# Patient Record
Sex: Female | Born: 1960 | Race: White | Hispanic: No | Marital: Single | Smoking: Former smoker
Health system: Southern US, Community
[De-identification: ages and names within clinical notes are randomized; demographics above are authoritative.]

## PROBLEM LIST (undated history)

## (undated) DIAGNOSIS — J449 Chronic obstructive pulmonary disease, unspecified: Secondary | ICD-10-CM

## (undated) DIAGNOSIS — K219 Gastro-esophageal reflux disease without esophagitis: Secondary | ICD-10-CM

## (undated) DIAGNOSIS — F419 Anxiety disorder, unspecified: Secondary | ICD-10-CM

## (undated) DIAGNOSIS — E119 Type 2 diabetes mellitus without complications: Secondary | ICD-10-CM

## (undated) DIAGNOSIS — G894 Chronic pain syndrome: Secondary | ICD-10-CM

## (undated) DIAGNOSIS — I10 Essential (primary) hypertension: Secondary | ICD-10-CM

## (undated) DIAGNOSIS — R519 Headache, unspecified: Secondary | ICD-10-CM

## (undated) DIAGNOSIS — G5600 Carpal tunnel syndrome, unspecified upper limb: Secondary | ICD-10-CM

## (undated) DIAGNOSIS — M1990 Unspecified osteoarthritis, unspecified site: Secondary | ICD-10-CM

## (undated) HISTORY — PX: CATARACT EXTRACTION: SUR2

## (undated) HISTORY — DX: Anxiety disorder, unspecified: F41.9

## (undated) HISTORY — PX: OTHER SURGICAL HISTORY: SHX169

## (undated) HISTORY — DX: Carpal tunnel syndrome, unspecified upper limb: G56.00

## (undated) HISTORY — PX: BACK SURGERY: SHX140

## (undated) HISTORY — DX: Chronic obstructive pulmonary disease, unspecified: J44.9

## (undated) HISTORY — PX: RIB RESECTION: SHX5077

## (undated) HISTORY — DX: Chronic pain syndrome: G89.4

---
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
## 1999-09-14 ENCOUNTER — Emergency Department (HOSPITAL_COMMUNITY): Admission: EM | Admit: 1999-09-14 | Discharge: 1999-09-14 | Payer: Self-pay | Admitting: Emergency Medicine

## 1999-09-22 ENCOUNTER — Encounter: Admission: RE | Admit: 1999-09-22 | Discharge: 1999-09-22 | Payer: Self-pay | Admitting: Hematology and Oncology

## 1999-09-22 ENCOUNTER — Encounter: Payer: Self-pay | Admitting: Hematology and Oncology

## 1999-09-22 ENCOUNTER — Ambulatory Visit (HOSPITAL_COMMUNITY): Admission: RE | Admit: 1999-09-22 | Discharge: 1999-09-22 | Payer: Self-pay | Admitting: Hematology and Oncology

## 2000-06-29 ENCOUNTER — Emergency Department (HOSPITAL_COMMUNITY): Admission: EM | Admit: 2000-06-29 | Discharge: 2000-06-29 | Payer: Self-pay | Admitting: Emergency Medicine

## 2001-02-09 ENCOUNTER — Emergency Department (HOSPITAL_COMMUNITY): Admission: EM | Admit: 2001-02-09 | Discharge: 2001-02-09 | Payer: Self-pay | Admitting: Emergency Medicine

## 2001-05-10 ENCOUNTER — Encounter: Payer: Self-pay | Admitting: Emergency Medicine

## 2001-05-10 ENCOUNTER — Emergency Department (HOSPITAL_COMMUNITY): Admission: EM | Admit: 2001-05-10 | Discharge: 2001-05-10 | Payer: Self-pay | Admitting: *Deleted

## 2001-07-31 ENCOUNTER — Encounter: Payer: Self-pay | Admitting: Emergency Medicine

## 2001-07-31 ENCOUNTER — Emergency Department (HOSPITAL_COMMUNITY): Admission: EM | Admit: 2001-07-31 | Discharge: 2001-07-31 | Payer: Self-pay | Admitting: Emergency Medicine

## 2001-08-16 ENCOUNTER — Encounter: Admission: RE | Admit: 2001-08-16 | Discharge: 2001-08-16 | Payer: Self-pay | Admitting: Internal Medicine

## 2001-08-22 ENCOUNTER — Encounter: Admission: RE | Admit: 2001-08-22 | Discharge: 2001-08-22 | Payer: Self-pay | Admitting: Internal Medicine

## 2001-09-01 ENCOUNTER — Encounter: Admission: RE | Admit: 2001-09-01 | Discharge: 2001-09-01 | Payer: Self-pay | Admitting: Internal Medicine

## 2001-09-08 ENCOUNTER — Encounter: Payer: Self-pay | Admitting: Internal Medicine

## 2001-09-08 ENCOUNTER — Ambulatory Visit (HOSPITAL_COMMUNITY): Admission: RE | Admit: 2001-09-08 | Discharge: 2001-09-08 | Payer: Self-pay | Admitting: Internal Medicine

## 2001-09-20 ENCOUNTER — Encounter: Admission: RE | Admit: 2001-09-20 | Discharge: 2001-09-20 | Payer: Self-pay | Admitting: Internal Medicine

## 2001-09-22 ENCOUNTER — Inpatient Hospital Stay (HOSPITAL_COMMUNITY): Admission: EM | Admit: 2001-09-22 | Discharge: 2001-09-25 | Payer: Self-pay | Admitting: Emergency Medicine

## 2001-09-22 ENCOUNTER — Encounter: Payer: Self-pay | Admitting: Infectious Diseases

## 2001-09-23 ENCOUNTER — Encounter: Payer: Self-pay | Admitting: Infectious Diseases

## 2001-09-29 ENCOUNTER — Encounter: Admission: RE | Admit: 2001-09-29 | Discharge: 2001-09-29 | Payer: Self-pay | Admitting: Internal Medicine

## 2001-10-06 ENCOUNTER — Encounter: Payer: Self-pay | Admitting: Infectious Diseases

## 2001-10-06 ENCOUNTER — Encounter: Admission: RE | Admit: 2001-10-06 | Discharge: 2001-10-06 | Payer: Self-pay | Admitting: Infectious Diseases

## 2001-10-09 ENCOUNTER — Encounter: Admission: RE | Admit: 2001-10-09 | Discharge: 2001-10-09 | Payer: Self-pay | Admitting: Internal Medicine

## 2001-10-20 ENCOUNTER — Encounter: Payer: Self-pay | Admitting: Infectious Diseases

## 2001-10-20 ENCOUNTER — Encounter: Admission: RE | Admit: 2001-10-20 | Discharge: 2001-10-20 | Payer: Self-pay | Admitting: Infectious Diseases

## 2001-10-27 ENCOUNTER — Encounter: Admission: RE | Admit: 2001-10-27 | Discharge: 2001-10-27 | Payer: Self-pay | Admitting: Internal Medicine

## 2001-11-29 ENCOUNTER — Encounter: Admission: RE | Admit: 2001-11-29 | Discharge: 2001-11-29 | Payer: Self-pay | Admitting: Internal Medicine

## 2001-12-19 ENCOUNTER — Encounter: Admission: RE | Admit: 2001-12-19 | Discharge: 2001-12-19 | Payer: Self-pay | Admitting: Internal Medicine

## 2002-01-23 ENCOUNTER — Encounter: Admission: RE | Admit: 2002-01-23 | Discharge: 2002-01-23 | Payer: Self-pay | Admitting: Internal Medicine

## 2002-03-30 ENCOUNTER — Encounter: Payer: Self-pay | Admitting: Orthopedic Surgery

## 2002-03-30 ENCOUNTER — Encounter (INDEPENDENT_AMBULATORY_CARE_PROVIDER_SITE_OTHER): Payer: Self-pay | Admitting: Specialist

## 2002-03-30 ENCOUNTER — Observation Stay (HOSPITAL_COMMUNITY): Admission: RE | Admit: 2002-03-30 | Discharge: 2002-03-31 | Payer: Self-pay | Admitting: Orthopedic Surgery

## 2002-04-17 ENCOUNTER — Encounter: Admission: RE | Admit: 2002-04-17 | Discharge: 2002-04-17 | Payer: Self-pay | Admitting: Internal Medicine

## 2002-05-17 ENCOUNTER — Encounter: Admission: RE | Admit: 2002-05-17 | Discharge: 2002-05-17 | Payer: Self-pay | Admitting: Internal Medicine

## 2002-07-03 ENCOUNTER — Encounter: Admission: RE | Admit: 2002-07-03 | Discharge: 2002-07-03 | Payer: Self-pay | Admitting: Internal Medicine

## 2002-07-29 ENCOUNTER — Inpatient Hospital Stay (HOSPITAL_COMMUNITY): Admission: EM | Admit: 2002-07-29 | Discharge: 2002-08-04 | Payer: Self-pay | Admitting: Psychiatry

## 2002-07-30 ENCOUNTER — Encounter: Payer: Self-pay | Admitting: Psychiatry

## 2002-08-08 ENCOUNTER — Encounter: Admission: RE | Admit: 2002-08-08 | Discharge: 2002-08-08 | Payer: Self-pay | Admitting: Internal Medicine

## 2002-08-19 ENCOUNTER — Emergency Department (HOSPITAL_COMMUNITY): Admission: EM | Admit: 2002-08-19 | Discharge: 2002-08-19 | Payer: Self-pay | Admitting: Emergency Medicine

## 2002-09-25 ENCOUNTER — Encounter: Admission: RE | Admit: 2002-09-25 | Discharge: 2002-09-25 | Payer: Self-pay | Admitting: Internal Medicine

## 2002-10-02 ENCOUNTER — Encounter: Payer: Self-pay | Admitting: Internal Medicine

## 2002-10-02 ENCOUNTER — Ambulatory Visit (HOSPITAL_COMMUNITY): Admission: RE | Admit: 2002-10-02 | Discharge: 2002-10-02 | Payer: Self-pay | Admitting: Internal Medicine

## 2002-11-02 ENCOUNTER — Encounter: Admission: RE | Admit: 2002-11-02 | Discharge: 2002-11-02 | Payer: Self-pay | Admitting: Internal Medicine

## 2002-11-05 ENCOUNTER — Encounter: Admission: RE | Admit: 2002-11-05 | Discharge: 2002-11-05 | Payer: Self-pay | Admitting: Internal Medicine

## 2002-11-23 ENCOUNTER — Encounter: Admission: RE | Admit: 2002-11-23 | Discharge: 2002-11-23 | Payer: Self-pay | Admitting: Internal Medicine

## 2003-01-28 ENCOUNTER — Encounter: Admission: RE | Admit: 2003-01-28 | Discharge: 2003-01-28 | Payer: Self-pay | Admitting: Internal Medicine

## 2003-02-28 ENCOUNTER — Encounter: Admission: RE | Admit: 2003-02-28 | Discharge: 2003-02-28 | Payer: Self-pay | Admitting: Internal Medicine

## 2003-05-30 ENCOUNTER — Encounter: Admission: RE | Admit: 2003-05-30 | Discharge: 2003-05-30 | Payer: Self-pay | Admitting: Internal Medicine

## 2003-06-24 ENCOUNTER — Ambulatory Visit (HOSPITAL_COMMUNITY): Admission: RE | Admit: 2003-06-24 | Discharge: 2003-06-24 | Payer: Self-pay | Admitting: Pain Medicine

## 2004-08-28 ENCOUNTER — Other Ambulatory Visit: Payer: Self-pay

## 2004-08-28 ENCOUNTER — Ambulatory Visit: Payer: Self-pay | Admitting: Orthopedic Surgery

## 2004-09-04 ENCOUNTER — Ambulatory Visit: Payer: Self-pay | Admitting: Orthopedic Surgery

## 2004-10-08 ENCOUNTER — Ambulatory Visit: Payer: Self-pay | Admitting: Internal Medicine

## 2009-06-26 ENCOUNTER — Emergency Department (HOSPITAL_COMMUNITY): Admission: EM | Admit: 2009-06-26 | Discharge: 2009-06-26 | Payer: Self-pay | Admitting: Emergency Medicine

## 2009-07-16 ENCOUNTER — Encounter: Admission: RE | Admit: 2009-07-16 | Discharge: 2009-07-16 | Payer: Self-pay | Admitting: Unknown Physician Specialty

## 2009-10-16 ENCOUNTER — Inpatient Hospital Stay (HOSPITAL_COMMUNITY): Admission: RE | Admit: 2009-10-16 | Discharge: 2009-10-19 | Payer: Self-pay | Admitting: Surgery

## 2009-10-16 ENCOUNTER — Encounter (INDEPENDENT_AMBULATORY_CARE_PROVIDER_SITE_OTHER): Payer: Self-pay | Admitting: Surgery

## 2010-01-28 ENCOUNTER — Encounter: Admission: RE | Admit: 2010-01-28 | Discharge: 2010-01-28 | Payer: Self-pay | Admitting: Unknown Physician Specialty

## 2010-03-25 ENCOUNTER — Encounter (INDEPENDENT_AMBULATORY_CARE_PROVIDER_SITE_OTHER): Payer: Self-pay | Admitting: *Deleted

## 2010-03-25 ENCOUNTER — Ambulatory Visit: Payer: Self-pay | Admitting: Gastroenterology

## 2010-03-25 DIAGNOSIS — R1013 Epigastric pain: Secondary | ICD-10-CM | POA: Insufficient documentation

## 2010-03-26 ENCOUNTER — Ambulatory Visit (HOSPITAL_COMMUNITY): Admission: RE | Admit: 2010-03-26 | Discharge: 2010-03-26 | Payer: Self-pay | Admitting: Gastroenterology

## 2010-03-26 ENCOUNTER — Ambulatory Visit: Payer: Self-pay | Admitting: Gastroenterology

## 2010-03-26 LAB — CONVERTED CEMR LAB
AST: 17 units/L (ref 0–37)
Albumin: 3.8 g/dL (ref 3.5–5.2)
Alkaline Phosphatase: 78 units/L (ref 39–117)
BUN: 7 mg/dL (ref 6–23)
Eosinophils Absolute: 0.1 10*3/uL (ref 0.0–0.7)
HCT: 42 % (ref 36.0–46.0)
Lymphs Abs: 2.4 10*3/uL (ref 0.7–4.0)
MCHC: 34 g/dL (ref 30.0–36.0)
MCV: 91.7 fL (ref 78.0–100.0)
Monocytes Absolute: 0.5 10*3/uL (ref 0.1–1.0)
Neutrophils Relative %: 73 % (ref 43.0–77.0)
Platelets: 273 10*3/uL (ref 150.0–400.0)
Potassium: 4.6 meq/L (ref 3.5–5.1)
Sodium: 139 meq/L (ref 135–145)
Total Bilirubin: 0.5 mg/dL (ref 0.3–1.2)
Total Protein: 6.8 g/dL (ref 6.0–8.3)

## 2010-03-31 ENCOUNTER — Encounter (INDEPENDENT_AMBULATORY_CARE_PROVIDER_SITE_OTHER): Payer: Self-pay | Admitting: *Deleted

## 2010-05-11 ENCOUNTER — Ambulatory Visit: Payer: Self-pay | Admitting: Gastroenterology

## 2010-08-08 ENCOUNTER — Encounter: Payer: Self-pay | Admitting: Pain Medicine

## 2010-08-20 NOTE — Letter (Signed)
Summary: Appt Reminder 2  [facility]  [address] [place], [place] [address]   Phone: [phone]  Fax: [phone]        March 31, 2010 MRN: [id number]    [facility] [address] [place], [place]  [address]    Dear Ms. Cathleen Fears,   You have a return appointment with Dr. Christella Hartigan on 05/11/10 at 3:15 pm.  Please remember to bring a complete list of the medicines you are taking, your insurance card and your co-pay.  If you have to cancel or reschedule this appointment, please call before 5:00 pm the evening before to avoid a cancellation fee.  If you have any questions or concerns, please call [phone].    Sincerely,    Chales Abrahams CMA (AAMA)  Appended Document: Appt Reminder 2 letter mailed

## 2010-08-20 NOTE — Assessment & Plan Note (Signed)
Review of gastrointestinal problems: 1. chronic abd pains: s/p open chole 2011.  normal CBC, cmet. Likely adhesion related versus functional dyspepsia  2. Daily narcotic usage 3. Gastritis, duodenitis; EGD 03/2010 biopsies showed no h. pylori    History of Present Illness Visit Type: Follow-up Visit Primary GI MD: Rob Bunting MD Primary Provider: Mayer Masker, MD Requesting Provider: na Chief Complaint: F/u visit  History of Present Illness:     at the time of her upper endoscopy, see those results summarized above. she has a taste of acid/soap in her mouth washing up from esophagus.  She quit smoking 15 days ago. Started excercising recently.            Current Medications (verified): 1)  Ms Contin 60 Mg Xr12h-Tab (Morphine Sulfate) .... Take 5 Tablets Daily 2)  Hydrocodone-Acetaminophen 10-500 Mg Tabs (Hydrocodone-Acetaminophen) .... Take 1 Tablet Every 6 Hours As Needed For Pain 3)  Diazepam 10 Mg Tabs (Diazepam) .... Take 1 Tablet Every 8 Hours As Needed 4)  Nexium 40 Mg Cpdr (Esomeprazole Magnesium) .... Once Daily 5)  Simvastatin 40 Mg Tabs (Simvastatin) .... Once Daily 6)  Ranitidine Hcl 300 Mg Tabs (Ranitidine Hcl) .... At Bedtime 7)  Lunesta 3 Mg Tabs (Eszopiclone) .... At Bedtime 8)  Xopenex 1.25 Mg/13ml Nebu (Levalbuterol Hcl) .... As Needed 9)  Imitrex Statdose Refill 6 Mg/0.10ml Kit (Sumatriptan Succinate) .... As Needed 10)  Advair Diskus 500-50 Mcg/dose Aepb (Fluticasone-Salmeterol) .... As Needed 11)  Flonase 50 Mcg/act Susp (Fluticasone Propionate) .... Two Times A Day Each Nostril 12)  Chantix Starting Month Pak 0.5 Mg X 11 & 1 Mg X 42 Tabs (Varenicline Tartrate) .... As Directed  Allergies (verified): 1)  ! Zoloft 2)  ! Paxil 3)  ! Darvocet  Vital Signs:  Patient profile:   50 year old female Height:      64 inches Weight:      229 pounds BMI:     39.45 BSA:     2.07 Pulse rate:   72 / minute Pulse rhythm:   regular BP sitting:   124 / 80  (left  arm) Cuff size:   large  Vitals Entered By: Ok Anis CMA (May 11, 2010 3:20 PM)  Physical Exam  Additional Exam:  Constitutional: Obese, otherwise generally well appearing Psychiatric: alert and oriented times 3 Abdomen: soft, non-tender, non-distended, normal bowel sounds    Impression & Recommendations:  Problem # 1:  GERD-like symptoms we will double her Nexium and see if that helps the acid, soapy taste in her mouth that washes out from her esophagus.  Patient Instructions: 1)  will double nexium so that you take 2 pills a day. 2)  The medication list was reviewed and reconciled.  All changed / newly prescribed medications were explained.  A complete medication list was provided to the patient / caregiver. Prescriptions: NEXIUM 40 MG CPDR (ESOMEPRAZOLE MAGNESIUM) take one pill twice daily  #60 x 5   Entered and Authorized by:   Rachael Fee MD   Signed by:   Rachael Fee MD on 05/11/2010   Method used:   Faxed to ...       [facility] (retail)       [address]       [place], [place]  [address]       Ph: [phone]       Fax: [phone]   RxID:   [id number]

## 2010-08-20 NOTE — Procedures (Signed)
Summary: Instructions for procedure/[place]  Instructions for procedure/[place]   Imported By: Sherian Rein 04/02/2010 10:13:25  _____________________________________________________________________  External Attachment:    Type:   Image     Comment:   External Document

## 2010-08-20 NOTE — Assessment & Plan Note (Signed)
History of Present Illness Visit Type: Initial Consult Primary GI MD: Rob Bunting MD Primary Braxxton Stoudt: Mayer Masker, MD Requesting Amaru Burroughs: Mayer Masker, MD Chief Complaint: RUQ pain History of Present Illness:     pleasant 50 year old woman who had GB removed this past march.  for chronic cholecystitis and cholelithiasis. The procedure had to be done open because the gallbladder was so solid fibrotic.  She has had pains since the surgery.  The pains are worse with coughing, jabbing from the inside. She cannot lay on right side without stabbing pains.  She has numbness on right side, skin.  Even putting a seatbelt on hurts.    She tells me that Dr. Gerrit Friends told her liver may be inflammed.  She is uncomfortable ALL the time.  She can have swelling at the incision site.  Belching will help at times, but not always.  Overall her weight is stable. Says she has no appetite.  Has had bad pyrosis for years. Takes nexium daily (takes in AM and does not eat BF or lunch).  Takes ranitidine at bedtime. Bed is on an incline.  takes 300mg  ms contin a day and 20-40mg  hydrocodone a day for back pains.    She is very constipated, takes docusate for this.  Miralax at times will also help.           Current Medications (verified): 1)  Ms Contin 60 Mg Xr12h-Tab (Morphine Sulfate) .... Take 5 Tablets Daily 2)  Hydrocodone-Acetaminophen 10-500 Mg Tabs (Hydrocodone-Acetaminophen) .... Take 1 Tablet Every 6 Hours As Needed For Pain 3)  Diazepam 10 Mg Tabs (Diazepam) .... Take 1 Tablet Every 8 Hours As Needed 4)  Nexium 40 Mg Cpdr (Esomeprazole Magnesium) .... Once Daily 5)  Simvastatin 40 Mg Tabs (Simvastatin) .... Once Daily 6)  Ranitidine Hcl 300 Mg Tabs (Ranitidine Hcl) .... At Bedtime 7)  Lunesta 3 Mg Tabs (Eszopiclone) .... At Bedtime 8)  Xopenex 1.25 Mg/91ml Nebu (Levalbuterol Hcl) .... As Needed 9)  Imitrex Statdose Refill 6 Mg/0.33ml Kit (Sumatriptan Succinate) .... As Needed 10)  Advair  Diskus 500-50 Mcg/dose Aepb (Fluticasone-Salmeterol) .... As Needed 11)  Flonase 50 Mcg/act Susp (Fluticasone Propionate) .... Two Times A Day Each Nostril  Allergies (verified): 1)  ! Zoloft 2)  ! Paxil 3)  ! Darvocet  Past History:  Past Medical History: chronic back pain Anxiety Arthritis Asthma Chronic headaches COPD Elevated cholesterol Obesity  Past Surgical History: appendectomy Open cholecystectomy 2011 Ovarian resection 2011 Back surgery 2004  Family History: no colon cancer  Social History: she is single, she is unemployed, she currently smokes cigarettes, she does not drink alcohol.  Review of Systems       Pertinent positive and negative review of systems were noted in the above HPI and GI specific review of systems.  All other review of systems was otherwise negative.   Vital Signs:  Patient profile:   50 year old female Height:      64 inches Weight:      226 pounds BMI:     38.93 Pulse rate:   68 / minute Pulse rhythm:   regular BP sitting:   150 / 72  (left arm) Cuff size:   large  Vitals Entered By: June McMurray CMA Duncan Dull) (March 25, 2010 2:50 PM)  Physical Exam  Additional Exam:  Constitutional: Obese Psychiatric: alert and oriented times 3 Eyes: extraocular movements intact Mouth: oropharynx moist, no lesions Neck: supple, no lymphadenopathy Cardiovascular: heart regular rate and rythm Lungs:  CTA bilaterally Abdomen: soft, mildly tender, non-distended, no obvious ascites, no peritoneal signs, normal bowel sounds, right upper quadrant well-healed cholecystectomy scar Extremities: no lower extremity edema bilaterally Skin: no lesions on visible extremities    Impression & Recommendations:  Problem # 1:  right upper quadrant discomforts suspect she may adhesive disease causing symptoms. GERD could contribute as well. She is on a massive amount of narcotic pain medicines and this could complicate any GI symptoms. I recommended  she is change the way that she is taking her proton pump inhibitor so that it is closer in relation to food. We'll proceed with EGD to rule out significant ulcer disease, gastritis. She'll get a basic set of labs including a CBC, metabolic profile.   Other Orders: TLB-CBC Platelet - w/Differential (85025-CBCD) TLB-CMP (Comprehensive Metabolic Pnl) (80053-COMP)  Patient Instructions: 1)  You will be scheduled to have an upper endoscopy at [facility] with propofol sedation. 2)  You should change the way you are taking your antiacid medicine (nexium) so that you are taking it 20-30 minutes prior to a decent meal as that is the way the pill is designed to work most effectively. 3)  You will get lab test(s) done today (cbc, cmet) 4)  A copy of this information will be sent to Dr. Gerrit Friends, [facility]. 5)  The medication list was reviewed and reconciled.  All changed / newly prescribed medications were explained.  A complete medication list was provided to the patient / caregiver.  Appended Document: Orders Update/EGD    Clinical Lists Changes  Orders: Added new Test order of ZEGD (ZEGD) - Signed

## 2010-08-20 NOTE — Procedures (Signed)
Summary: Upper Endoscopy  Patient: Caitlin Moore Note: All result statuses are Final unless otherwise noted.  Tests: (1) Upper Endoscopy (EGD)   EGD Upper Endoscopy       DONE     [facility]     [address] [place], [place]  [address]           ENDOSCOPY PROCEDURE REPORT           PATIENT:  Caitlin, Moore  MR#:  [id number]     BIRTHDATE:  September 07, 1960, 49 yrs. old  GENDER:  female     ENDOSCOPIST:  Rachael Fee, MD     Referred by:  Mayer Masker, M.D.     PROCEDURE DATE:  03/26/2010     PROCEDURE:  EGD with biopsy     ASA CLASS:  Class II     INDICATIONS:  Abdominal pain, chronic (normal CMET, CBC, s/p open     chole earlier this year)     MEDICATIONS:  MAC sedation, administered by CRNA     TOPICAL ANESTHETIC:  Cetacaine Spray           DESCRIPTION OF PROCEDURE:   After the risks benefits and     alternatives of the procedure were thoroughly explained, informed     consent was obtained.  The Pentax Gastroscope [id number] endoscope     was introduced through the mouth and advanced to the second     portion of the duodenum, without limitations.  The instrument was     slowly withdrawn as the mucosa was fully examined.     <<PROCEDUREIMAGES>>     There was mild, non-specific gastritis and bulbar duodenitis.     Biopsies were taken from stomach to check for H. pylori (see     image2 and image5).  Otherwise the examination was normal (see     image6, image4, and image3).    Retroflexed views revealed no     abnormalities.    The scope was then withdrawn from the patient     and the procedure completed.     COMPLICATIONS:  None           ENDOSCOPIC IMPRESSION:     1) Mild gastritis and duodenitis.  Biopsies taken to check for     H. pylori.  Not clear if these findings explain her abdominal     pains. Being on such high doses of daily narcotics can sometimes     make it difficult to determine etiology of abdominal pains.    2)     Otherwise normal  examination           RECOMMENDATIONS:     If biopsies show H. pylori, she will be started on the correct     antibiotics.     She knows to continue nexium but to take it 20-30 min prior to a     meal.     ______________________________     Rachael Fee, MD           cc: Darnell Level, MD           n.     Rosalie Doctor:   Rachael Fee at 03/26/2010 01:54 PM           Leonia Reeves, [id number]  Note: An exclamation mark (!) indicates a result that was not dispersed into the flowsheet. Document Creation Date: 03/26/2010 1:55 PM _______________________________________________________________________  (1) Order result status: Final Collection or  observation date-time: 03/26/2010 13:46 Requested date-time:  Receipt date-time:  Reported date-time:  Referring Physician:   Ordering Physician: Rob Bunting [phone]) Specimen Source:  Source: Launa Grill Order Number: [id number] Lab site:

## 2010-08-20 NOTE — Letter (Signed)
Summary: EGD Instructions  [facility]  [address] [place], [place] [address]   Phone: [phone]  Fax: [phone]       Caitlin Moore    01/26/1961    MRN: [id number]       Procedure Day /Date:03/26/10  [name]     Arrival Time: 12 noon     Procedure Time:2pm     Location of Procedure:                     [facility] ( Outpatient Registration)   PREPARATION FOR ENDOSCOPY   On 03/26/10  THE DAY OF THE PROCEDURE:  Nothing to eat or drink after midnight  MEDICATION INSTRUCTIONS  Unless otherwise instructed, you should take regular prescription medications with a small sip of water as early as possible the morning of your procedure.             OTHER INSTRUCTIONS  You will need a responsible adult at least 49 years of age to accompany you and drive you home.   This person must remain in the waiting room during your procedure.  Wear loose fitting clothing that is easily removed.  Leave jewelry and other valuables at home.  However, you may wish to bring a book to read or an iPod/MP3 player to listen to music as you wait for your procedure to start.  Remove all body piercing jewelry and leave at home.  Total time from sign-in until discharge is approximately 2-3 hours.  You should go home directly after your procedure and rest.  You can resume normal activities the day after your procedure.  The day of your procedure you should not:   Drive   Make legal decisions   Operate machinery   Drink alcohol   Return to work  You will receive specific instructions about eating, activities and medications before you leave.    The above instructions have been reviewed and explained to me by   _______________________    I fully understand and can verbalize these instructions _____________________________ Date _________

## 2010-10-07 LAB — COMPREHENSIVE METABOLIC PANEL
AST: 64 U/L — ABNORMAL HIGH (ref 0–37)
BUN: 6 mg/dL (ref 6–23)
CO2: 25 mEq/L (ref 19–32)
Calcium: 8.4 mg/dL (ref 8.4–10.5)
Chloride: 102 mEq/L (ref 96–112)
Creatinine, Ser: 0.62 mg/dL (ref 0.4–1.2)
GFR calc Af Amer: >60 mL/min (ref >60)
GFR calc non Af Amer: >60 mL/min (ref >60)
Total Bilirubin: 1 mg/dL (ref 0.3–1.2)

## 2010-10-07 LAB — CBC
HCT: 42.7 % (ref 36.0–46.0)
MCHC: 32.7 g/dL (ref 30.0–36.0)
MCV: 92.5 fL (ref 78.0–100.0)
RBC: 4.61 MIL/uL (ref 3.87–5.11)
WBC: 18.7 10*3/uL — ABNORMAL HIGH (ref 4.0–10.5)

## 2010-10-09 LAB — RAPID URINE DRUG SCREEN, HOSP PERFORMED
Amphetamines: NOT DETECTED
Benzodiazepines: NOT DETECTED
Benzodiazepines: NOT DETECTED
Cocaine: NOT DETECTED
Opiates: POSITIVE — AB

## 2010-10-09 LAB — URINALYSIS, ROUTINE W REFLEX MICROSCOPIC
Nitrite: NEGATIVE
Protein, ur: NEGATIVE mg/dL
Specific Gravity, Urine: 1.024 (ref 1.005–1.030)
Urobilinogen, UA: 0.2 mg/dL (ref 0.0–1.0)

## 2010-10-09 LAB — DIFFERENTIAL
Eosinophils Relative: 2 % (ref 0–5)
Lymphocytes Relative: 30 % (ref 12–46)
Lymphs Abs: 2.4 10*3/uL (ref 0.7–4.0)
Monocytes Relative: 6 % (ref 3–12)
Neutrophils Relative %: 61 % (ref 43–77)

## 2010-10-09 LAB — COMPREHENSIVE METABOLIC PANEL
ALT: 20 U/L (ref 0–35)
AST: 16 U/L (ref 0–37)
CO2: 28 mEq/L (ref 19–32)
Calcium: 9.3 mg/dL (ref 8.4–10.5)
Creatinine, Ser: 0.85 mg/dL (ref 0.4–1.2)
GFR calc Af Amer: >60 mL/min (ref >60)
GFR calc non Af Amer: >60 mL/min (ref >60)
Sodium: 138 mEq/L (ref 135–145)
Total Protein: 7.4 g/dL (ref 6.0–8.3)

## 2010-10-09 LAB — CBC
MCHC: 33.3 g/dL (ref 30.0–36.0)
MCV: 91.6 fL (ref 78.0–100.0)
RBC: 4.66 MIL/uL (ref 3.87–5.11)
RDW: 12.9 % (ref 11.5–15.5)

## 2010-10-09 LAB — URINE MICROSCOPIC-ADD ON

## 2010-10-09 LAB — PROTIME-INR
INR: 0.98 (ref 0.00–1.49)
Prothrombin Time: 12.9 seconds (ref 11.6–15.2)

## 2010-10-09 LAB — PREGNANCY, URINE: Preg Test, Ur: NEGATIVE

## 2010-10-14 ENCOUNTER — Emergency Department (HOSPITAL_COMMUNITY)
Admission: EM | Admit: 2010-10-14 | Discharge: 2010-10-14 | Disposition: A | Discharge status: Home / Self Care | Payer: Medicare Other | Attending: Emergency Medicine | Admitting: Emergency Medicine

## 2010-10-14 DIAGNOSIS — N390 Urinary tract infection, site not specified: Secondary | ICD-10-CM | POA: Insufficient documentation

## 2010-10-14 DIAGNOSIS — J4489 Other specified chronic obstructive pulmonary disease: Secondary | ICD-10-CM | POA: Insufficient documentation

## 2010-10-14 DIAGNOSIS — J449 Chronic obstructive pulmonary disease, unspecified: Secondary | ICD-10-CM | POA: Insufficient documentation

## 2010-10-14 DIAGNOSIS — K2970 Gastritis, unspecified, without bleeding: Secondary | ICD-10-CM | POA: Insufficient documentation

## 2010-10-14 DIAGNOSIS — F17200 Nicotine dependence, unspecified, uncomplicated: Secondary | ICD-10-CM | POA: Insufficient documentation

## 2010-10-14 LAB — DIFFERENTIAL
Basophils Absolute: 0 10*3/uL (ref 0.0–0.1)
Basophils Relative: 0 % (ref 0–1)
Lymphocytes Relative: 25 % (ref 12–46)
Neutro Abs: 3.3 10*3/uL (ref 1.7–7.7)
Neutrophils Relative %: 63 % (ref 43–77)

## 2010-10-14 LAB — URINE MICROSCOPIC-ADD ON

## 2010-10-14 LAB — CBC
Hemoglobin: 14.6 g/dL (ref 12.0–15.0)
MCHC: 32.6 g/dL (ref 30.0–36.0)
RBC: 4.87 MIL/uL (ref 3.87–5.11)
WBC: 5.2 10*3/uL (ref 4.0–10.5)

## 2010-10-14 LAB — URINALYSIS, ROUTINE W REFLEX MICROSCOPIC
Bilirubin Urine: NEGATIVE
Nitrite: NEGATIVE
pH: 6 (ref 5.0–8.0)

## 2010-10-14 LAB — COMPREHENSIVE METABOLIC PANEL
ALT: 44 U/L — ABNORMAL HIGH (ref 0–35)
AST: 37 U/L (ref 0–37)
Alkaline Phosphatase: 70 U/L (ref 39–117)
CO2: 26 mEq/L (ref 19–32)
Calcium: 8.8 mg/dL (ref 8.4–10.5)
Chloride: 101 mEq/L (ref 96–112)
GFR calc Af Amer: >60 mL/min (ref >60)
GFR calc non Af Amer: >60 mL/min (ref >60)
Potassium: 3.4 mEq/L — ABNORMAL LOW (ref 3.5–5.1)
Sodium: 137 mEq/L (ref 135–145)

## 2010-10-15 LAB — URINE CULTURE

## 2010-10-20 LAB — DIFFERENTIAL
Basophils Absolute: 0 10*3/uL (ref 0.0–0.1)
Basophils Relative: 0 % (ref 0–1)
Eosinophils Absolute: 0 10*3/uL (ref 0.0–0.7)
Eosinophils Relative: 0 % (ref 0–5)
Lymphocytes Relative: 26 % (ref 12–46)
Lymphs Abs: 1.7 10*3/uL (ref 0.7–4.0)
Monocytes Absolute: 0.7 10*3/uL (ref 0.1–1.0)
Monocytes Relative: 11 % (ref 3–12)
Neutro Abs: 4.2 10*3/uL (ref 1.7–7.7)
Neutrophils Relative %: 63 % (ref 43–77)

## 2010-10-20 LAB — CBC
MCHC: 33.3 g/dL (ref 30.0–36.0)
MCV: 89.1 fL (ref 78.0–100.0)
RBC: 5.19 MIL/uL — ABNORMAL HIGH (ref 3.87–5.11)
RDW: 13.6 % (ref 11.5–15.5)

## 2010-10-20 LAB — COMPREHENSIVE METABOLIC PANEL WITH GFR
ALT: 43 U/L — ABNORMAL HIGH (ref 0–35)
AST: 29 U/L (ref 0–37)
Albumin: 3.2 g/dL — ABNORMAL LOW (ref 3.5–5.2)
Alkaline Phosphatase: 71 U/L (ref 39–117)
BUN: 11 mg/dL (ref 6–23)
CO2: 24 meq/L (ref 19–32)
Calcium: 8.5 mg/dL (ref 8.4–10.5)
Chloride: 97 meq/L (ref 96–112)
Creatinine, Ser: 0.96 mg/dL (ref 0.4–1.2)
GFR calc non Af Amer: >60 mL/min
Glucose, Bld: 113 mg/dL — ABNORMAL HIGH (ref 70–99)
Potassium: 3.3 meq/L — ABNORMAL LOW (ref 3.5–5.1)
Sodium: 131 meq/L — ABNORMAL LOW (ref 135–145)
Total Bilirubin: 0.5 mg/dL (ref 0.3–1.2)
Total Protein: 7.2 g/dL (ref 6.0–8.3)

## 2010-10-20 LAB — URINALYSIS, ROUTINE W REFLEX MICROSCOPIC
Hgb urine dipstick: NEGATIVE
Specific Gravity, Urine: 1.028 (ref 1.005–1.030)
pH: 6 (ref 5.0–8.0)

## 2010-10-20 LAB — LIPASE, BLOOD: Lipase: 14 U/L (ref 11–59)

## 2010-10-20 LAB — URINE MICROSCOPIC-ADD ON

## 2010-12-04 NOTE — Discharge Summary (Signed)
[place]  Patient:    Caitlin Moore, Caitlin Moore Visit Number: [id number] MRN: [id number]          Service Type: MED Location: [phone] Attending Physician:  Levy Sjogren Dictated by:   Kerrie Pleasure, M.D. Admit Date:  09/22/2001 Discharge Date: 09/25/2001   CC:         Payton Doughty, M.D., Neurosurgery   Discharge Summary  DISCHARGE DIAGNOSES: 1. Chronic back pain with decreased sensation secondary to spinal stenosis. 2. Chronic bronchitis. 3. Gastroesophageal reflux disease. 4. Allergic rhinitis. 5. Tobacco abuse.  DISCHARGE MEDICATIONS: 1. Prevacid 30 mg q.d. 2. Flexeril 10 mg t.i.d. 3. Albuterol MDI two puffs b.i.d. 4. Percocet 7.5/500 mg two tabs q.4-6h. as needed. 5. Valium 5 mg t.i.d. 6. Atarax 10 mg t.i.d.  DISPOSITION AND FOLLOWUP:  The patient is to follow up with Dr. Olena Leatherwood on October 09, 2001 at 3 p.m. at [facility].  During that visit the patients level of function will be reevaluated following her hospital treatment.  Also the patient is to schedule an appointment following that visit, if needed, with neurosurgery, Dr. Channing Mutters, who will reevaluate the patient as an outpatient.  If her pain persists, he may or may not schedule her for an outpatient surgery.  CONSULTATIONS:  Neurosurgery - Payton Doughty, M.D.  PROCEDURES PERFORMED: 1. MRI of the spine performed on September 22, 2001 which shows slight increase in    size of central HNP and degree of central canal and bilateral-lateral    recess stenosis at L3-4.  There was no significant change in moderate    central canal stenosis or central HNP at L4-5.  There was also    posterolateral disk protrusions which are noted on the right at L4-5 and on    the left at L3-4 which were both noted previously.  MRI also showed    posterior subcutaneous fluid collection of undetermined etiology. 2. Fluoroscopic-guided _subdural injection of steroids which was also  done    September 23, 2001 by radiology for relief of the patients back pain.  HISTORY OF PRESENT ILLNESS:  The patient is a 50 year old white female who is a patient of Dr. Ladell Pier.  She presented to the ED on September 22, 2001 with history of pain in the back.  She has been unable to sleep or get comfortable due to the severity of the pain.  The pain was as high as 10/10 initially but dropped down to 6/10.  It was radiating down her left leg and into her foot the morning of admission.  She also said she developed some urinary incontinence and numbness in her second, third, and fourth toes on the left foot that has been going on gradually for over one month but she denied any saddle paresthesia and her pain was said to be burning in nature and stinging in both her buttocks, it was constant, throbbing like, but she stated never hurt like that in her life.  She has had history of chronic back pain, has had a previous MRI that showed anterolisthesis of L3 on L4 with broad-based disk herniation which ____________ of the cul-de-sac and resultant moderate stenosis of the canal of L4-L5.  She also had disk protrusion with moderate canal stenosis during that admission.  PHYSICAL EXAMINATION:  On exam during this admission she had a temperature 98.3, blood pressure 182/117, pulse of 100, respirations of 20.  She had diminished pinprick sensation on the  left foot anterolaterally corresponding to the region of L5-S1.  Her knee reflexes were also 3+ brisk while the Achilles were 1+ bilaterally.  There was no clubbing bilaterally.  Her SLR was initially limited due to pain but later was found to be positive.  Her physical exam was otherwise uneventful.  ADMISSION LABORATORIES:  White count of 5.2, hemoglobin 13.8, platelets 350. Sodium of 135, potassium 4.1, chloride 106, CO2 of 22, BUN of 10, creatinine 0.7, glucose of 90, and calcium of 9.  Her liver panel was essentially normal. PTT was 35, PT  12.6, INR was 0.9.  HOSPITAL COURSE: #1 - BACK PAIN/LEFT LOWER EXTREMITY PAIN:  With the patients presentation and prior MRI showing possible spinal stenosis, the patient was admitted promptly for management of these and to rule out possibility of cauda equina, especially with a history of possible urinary incontinence.  A catheter was inserted and neurosurgery was immediately consulted.  The patient was started on pain control, initially morphine but later converted to Dilaudid.  She was placed on 2 mg IV q.4 which was later changed to q.2 and then q.3.  As of the time of discharge the patients requirement of Dilaudid has decreased to q.5-6h.  She was also started on Valium (diazepam) mainly to help with her muscle relaxation as well as to calm the patients anxiety down and that helped the patient through the night and even daytime.  A repeat MRI was also ordered which shows progression from her previous MRI as noted above.  Per neurosurgery, she was evaluated and found to have findings not consistent with cauda equina syndrome.  As such a trial of epidural injection of steroids was started per radiology which was done on September 23, 2001 with 4 cc of 0.25 __________ plus 120 mg ______ that was well tolerated by the patient.  The patient was advised that it would take at least 48 hours for the effect to start being felt by the patient and as of the time of discharge the patients requirement for Dilaudid has decreased to q.6h. from the initial q.2h.  She is also to follow as an outpatient with neurosurgery and was given the number to call to schedule an appointment.  Also an arrangement has been made for a second and third epidural steroid injection as an outpatient in 1 and 3 weeks, respectively.  #2 - BRONCHITIS:  The patient was placed on mini nebulizers, albuterol and Atrovent nebulizers, during this admission.  There was no flare of her bronchitis.  #3 - GASTROESOPHAGEAL REFLUX  DISEASE:  The patient was placed on Protonix and  placed on Prevacid during this admission.  There was no recurrence of her GERD.  #4 - ALLERGIC RHINITIS:  The patient was also continuing to take her Claritin.  #5 - TOBACCO ABUSE:  The patient was counseled about her tobacco use.  #6 - ANXIETY:  The patient was maintained on Valium as mentioned for both her anxiety as well as muscle relaxation. Dictated by:   Kerrie Pleasure, M.D. Attending Physician:  Levy Sjogren DD:  09/25/01 TD:  09/27/01 Job: [id number]

## 2010-12-04 NOTE — Op Note (Signed)
NAME:  Caitlin Moore, Caitlin Moore                        ACCOUNT NO.:  [id number]   MEDICAL RECORD NO.:  [id number]                   PATIENT TYPE:  OBV   LOCATION:  0473                                 FACILITY:  [facility]   PHYSICIAN:  Illene Labrador. Aplington, M.D.            DATE OF BIRTH:  May 18, 1961   DATE OF PROCEDURE:  03/30/2002  DATE OF DISCHARGE:  03/31/2002                                 OPERATIVE REPORT   PREOPERATIVE DIAGNOSES:  1. Spinal stenoses L3-4 and L4-5.  2. Apparent disk herniation L3-4 left and L4-5 right.   POSTOPERATIVE DIAGNOSES:  1. Spinal stenosis L3-4, L4-5.  2. Spondylosis L3-4.  3. Small disk herniation L4-5 right.   OPERATION:  Central decompressive laminectomy L3-4 and L4-5 with inspection  of L3-4 disk left and microdiskectomy of L4-5 disk right.   SURGEON:  Illene Labrador. Aplington, M.D.   ASSISTANT:  Georges Lynch. Darrelyn Hillock, M.D.   ANESTHESIA:  General.   PATHOLOGY AND JUSTIFICATION FOR PROCEDURE:  She was having significant  bilateral leg symptoms with an MRI demonstrating subluxation of L3 on L4 of  2 mm with an apparent disk herniation above L3. She also had a small central  to the right disk herniation at L4-5. Dr. Darrelyn Hillock agreed with my  interpretation that the dominant finding was significant spinal stenosis at  L3-4 and L4-5 and this was well worn out at surgery.   DESCRIPTION OF PROCEDURE:  Prophylactic antibiotics, satisfactory general  anesthesia, knee chest position in the [place] frame, back was prepped with  Duraprep, three spinal needles and lateral x-ray we tentatively localized  the L3-4 and L4-5 interspaces. I then continued draping the back in a  sterile field with Ioban and made my initial incision tieing in the two  spinous processes at the level in the depth of the wound and a second  lateral x-ray was taken indicating these clamps were on L4 and L3. I then  extended the incision slightly cephalad to better expose the L3 lamina and  then we  placed self retaining McCullough retainers at dissected soft tissue  off the lamina of the inferior portion of L3, L4 and the superior portion of  L5. With double action rongeur primarily but also with Kerrison rongeur, I  removed the neural arch of L4 and then worked both cephalad and caudad  removing ligamentum flavum between L3-4 and L4-5 as well as the inferior  portion of the lamina of L3 and the superior portion of L5. She had  significant lateral recess stenosis as well as central stenosis. Working  first on the right side, we looked at L4-5. The L5 nerve root was freed up,  finally the L4-5 disk identified and opened with a 15 knife blade. We could  not get a lot of the disk material out. All that was obtained we did with  Epstein curette and pituitary. I then went superiorly on the right L3-4 and  decompressed the L4 nerve root. I then went to the left side where at L4-5,  the l5 nerve root was thoroughly decompressed and I then moved cephalad and  we identified the L4 nerve root which we were able to mobilize medially. The  disk was noted and was found to be hard and Dr. Darrelyn Hillock and I both concluded  that this was really more of a pseudodisk due to the subluxation of L3 on  L4. With all four nerve roots being thoroughly decompressed centrally, we  felt that we had completed the operation. The wound was irrigated well with  sterile saline, there was no unusual bleeding. She was given 30 mg of  Toradol IV, Gelfoam was placed over the dura and self retaining retractors  were removed. Some minimal muscle bleeding was coagulated. I then closed the  fascia loosely to relieve any bleeding pressure with a small aperture  cephalad with #1 Vicryl as well as the deep subcutaneous tissue, superficial  subcutaneous tissue with 2-0 Vicryl and the skin with staples. Betadine  adaptic dry sterile dressing were applied. She tolerated the procedure well.  On waking her up, she tried to help herself  off the operating room table and  her right leg went down onto the floor but we were able to assist her into  her PACU bed gently without any known complication.                                               James P. Aplington, M.D.    JPA/MEDQ  D:  03/30/2002  T:  03/31/2002  Job:  [id number]
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
## 2010-12-04 NOTE — Consult Note (Signed)
[place]  Patient:    Caitlin Moore, Caitlin Moore Visit Number: [id number] MRN: [id number]          Service Type: MED Location: [phone] Attending Physician:  Levy Sjogren Dictated by:   Payton Doughty, M.D. Proc. Date: 09/22/01 Admit Date:  09/22/2001 Discharge Date: 09/25/2001                            Consultation Report  REQUESTING PHYSICIAN:  Fransisco Hertz, M.D.  HISTORY:  The patient is a 50 year old, right-handed white female who has a two to four week history of increasing left lower extremity pain. She had back pain for some time. She was in the outpatient clinic in the middle of February and obtained an MRI which demonstrated a moderate spinal stenosis of L3-4 and L4-5. She reports back today because she said she got up off of the couch and started walking and had some incontinence. She was able to stop this incontinence as she was walking. She had an MRI obtained today that showed a modest increase in the disk at L3-4 with modest multifactorial spinal stenosis at L3-4, less at L4-5 and I was asked to visit with her. Historically, she has had back pain for a while. She was a Naval architect. She has been off and on on Lorcet and Percocet.  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  MEDICATIONS: 1. Albuterol. 2. Prevacid. 3. Claritin. 4. Ibuprofen.  ALLERGIES:  DARVOCET.  SOCIAL HISTORY:  She smokes a pack of cigarettes a day and drinks alcohol on occasion.  PHYSICAL EXAMINATION:  GENERAL:  Intact.  NEUROLOGICAL:  She is awake alert and oriented x 3. Her pupils are equal, round, and react to light. Her extraocular movements are intact. Facial movement to sensation is intact. Tongue is in the midline. Shoulder shrug was normal. She describes no swallowing difficulties. Motor exam demonstrates 5/5 strength throughout the upper and lower extremities. She has a bit of numbness in  the left L4 distribution. Reflexes, however, are two at the knees and one at the ankles. Toes downgoing bilaterally. Straight leg raise is positive on the left. Straight leg raise on the right is negative and reverse straight leg raise from the right leg is negative.  DIAGNOSTIC DATA:  As noted above, the MRI shows a slight increase in the L3-4 disk with modest multifactorial spinal stenosis.  ASSESSMENT:  Left L4 radiculopathy secondary to stenosis L3-4. This lady does not have a cauda equina syndrome. She has intact ankle jerks suggesting complete integrity of the S1 nerve root, and the spinal stenosis is not nearly severe enough to cause a cauda equina syndrome. I would have her participate in lumbar epidural steroid injections and see if that will afford her some relief from her discomfort. If it does not over the next couple of weeks, she may be a candidate for a decompression and fusion. I will continue to see her when she is in the hospital but epidural steroids can be done as an outpatient. Dictated by:   Payton Doughty, M.D. Attending Physician:  Levy Sjogren DD:  09/22/01 TD:  09/24/01 Job: [id number]

## 2010-12-04 NOTE — Discharge Summary (Signed)
NAME:  Caitlin Moore, KRETCHMER NO.:  [id number]   MEDICAL RECORD NO.:  [id number]                   PATIENT TYPE:  IPS   LOCATION:  0504                                 FACILITY:  [facility]   PHYSICIAN:  Jeanice Lim, M.D.              DATE OF BIRTH:  1961-05-14   DATE OF ADMISSION:  07/29/2002  DATE OF DISCHARGE:  08/04/2002                                 DISCHARGE SUMMARY   IDENTIFYING DATA:  This is a 50 year old single Caucasian female,  voluntarily admitted, taken by her brother to mental health because she was  attempting to harm herself, endorsed for four to six weeks, tearfulness,  helplessness, generalized irritability with frequent suicidal thoughts of  electrocuting herself by dropping a hairdryer into sink, and vague paranoia.   MEDICATIONS:  1. MS Contin 60 mg t.i.d.  2. Percocet 7.5/325 mg q.4h. p.r.n. breakthrough pain.  3. Valium previously prescribed by stopped by Dr. Ladell Pier.  4. Albuterol inhaler for wheezing.  5. Klonopin previously prescribed when Valium was stopped; the patient was     unable to afford this.  6. Prevacid 30 mg q.a.m.  7. Atarax 25 mg p.r.n. anxiety.  8. Senokot laxative p.r.n. constipation.   DRUG ALLERGIES:  PAXIL and ZOLOFT which cause headaches, AMITRIPTYLINE,  DARVOCET-N and AMBIEN.   PHYSICAL EXAMINATION:  Physical exam essentially within normal limits,  neurologically nonfocal.   LABORATORY DATA:  Routine admission labs, CBC and CMET were within normal  limits.  Urinalysis negative.   MENTAL STATUS EXAMINATION:  Obese female with anxious and tearful affect,  closing eyes at times, emotionally withdrawn, cooperative with encouragement  and calms with further conversation.  Mood was somewhat labile and anxious,  thought process goal directed, thought content positive for paranoia,  feeling that people were talking about her, and somewhat guarded.  Cognition  was intact.  She had hopelessness regarding  her situation at home and  chronic pain and inability to afford medications.  Judgment and insight  fair.   ADMITTING DIAGNOSES:   AXES I:  1. Major depressive disorder, recurrent, severe, with psychotic features.  2. Benzodiazepine abuse and opiate abuse by history and opiate dependence,     iatrogenic.   AXIS II:  None.   AXES III:  1. History of bronchitis.  2. Chronic sciatica and significant back pain.   AXIS IV:  Moderate-to-severe stress with relationship conflicts, current  partner, financial stress, loss of job and no income and chronic medical  conditions.   AXIS V:  30/60.   HOSPITAL COURSE:  The patient was admitted, ordered routine p.r.n.  medications, underwent further monitoring and was encouraged to participate  in individual, group and milieu therapy.  The patient was initially placed  on fall risk and was restarted on previous medications including MS Contin,  Percocet, diazepam, albuterol, Prevacid, Atarax.  Vital signs were ordered  every shift for the first 48  hours and the patient was placed on low-dose  Librium detox protocol to detox off benzodiazepines.  Internal medicine was  consulted and the patient's medical medications were adjusted; the patient  was also evaluated for report of chronic constipation and possible  impaction.  Case Management assisted patient with psychosocial issues,  concerns and barriers to compliance with outpatient recommendations and  medications.  Lexapro was optimized __________ depressive symptoms and the  patient reported gradual improvement in symptoms and her condition at  discharge was markedly improved, mood was more euthymic, affect brighter,  thought process goal directed and thought content negative for dangerous  ideations or psychotic symptoms.  The patient reported motivation to be  compliant with the aftercare plan and to participate in aftercare planning  including a family meeting with her support system.    DISCHARGE MEDICATIONS:  The patient was discharged on medications of:  1. MiraLax powder 17 g b.i.d.  2. Protonix 40 mg q.a.m.  3. Desyrel 25 mg b.i.d.  4. Flonase nasal spray -- two sprays twice a day.  5. Advair one puff twice a day.  6. Atrovent inhaler two puffs four times a day.  7. Humibid 600 mg two tabs twice a day.  8. Albuterol two puffs q.4h. p.r.n. asthma.  9. Clarinex 5 mg q.a.m.  10.      MS Contin and Percocet as previously prescribed.  11.      Lexapro 20 mg q.a.m.  12.      Seroquel 50 mg to 100 mg q.h.s. p.r.n.   FOLLOWUP:  The patient was to follow up with [facility].   DISCHARGE DIAGNOSES:   AXES I:  1. Major depressive disorder, recurrent, severe, with psychotic features.  2. Benzodiazepine abuse and opiate abuse by history and opiate dependence,     iatrogenic.   AXIS II:  None.   AXES III:  1. History of bronchitis.  2. Chronic sciatica and significant back pain.    AXIS IV:  Moderate-to-severe stress with relationship conflicts, current  partner, financial stress, loss of job and no income and chronic medical  conditions.   AXIS V:  Global assessment of functioning on discharge was 50 to 55.                                                  Jeanice Lim, M.D.    JEM/MEDQ  D:  08/16/2002  T:  08/16/2002  Job:  [id number]

## 2013-06-05 ENCOUNTER — Other Ambulatory Visit: Payer: Self-pay | Admitting: Family Medicine

## 2013-06-05 ENCOUNTER — Ambulatory Visit
Admission: RE | Admit: 2013-06-05 | Discharge: 2013-06-05 | Disposition: A | Discharge status: Home / Self Care | Payer: Medicare Other | Source: Ambulatory Visit | Attending: Family Medicine | Admitting: Family Medicine

## 2013-06-05 DIAGNOSIS — R05 Cough: Secondary | ICD-10-CM

## 2013-06-19 ENCOUNTER — Other Ambulatory Visit: Payer: Self-pay | Admitting: Family Medicine

## 2013-06-19 DIAGNOSIS — R05 Cough: Secondary | ICD-10-CM

## 2013-06-19 DIAGNOSIS — J449 Chronic obstructive pulmonary disease, unspecified: Secondary | ICD-10-CM

## 2013-06-21 ENCOUNTER — Ambulatory Visit
Admission: RE | Admit: 2013-06-21 | Discharge: 2013-06-21 | Disposition: A | Discharge status: Home / Self Care | Payer: Medicare Other | Source: Ambulatory Visit | Attending: Family Medicine | Admitting: Family Medicine

## 2013-06-21 DIAGNOSIS — J449 Chronic obstructive pulmonary disease, unspecified: Secondary | ICD-10-CM

## 2013-06-21 DIAGNOSIS — R05 Cough: Secondary | ICD-10-CM

## 2013-06-21 MED ORDER — IOHEXOL 300 MG/ML  SOLN
100.0000 mL | Freq: Once | INTRAMUSCULAR | Status: AC | PRN
  Administered 2013-06-21: 100 mL via INTRAVENOUS

## 2013-09-03 ENCOUNTER — Encounter: Payer: Self-pay | Admitting: Neurology

## 2013-09-03 ENCOUNTER — Encounter (INDEPENDENT_AMBULATORY_CARE_PROVIDER_SITE_OTHER): Payer: Self-pay

## 2013-09-03 ENCOUNTER — Ambulatory Visit (INDEPENDENT_AMBULATORY_CARE_PROVIDER_SITE_OTHER): Payer: Medicare Other | Admitting: Neurology

## 2013-09-03 VITALS — BP 158/90 | HR 71 | Ht 63.0 in | Wt 262.0 lb

## 2013-09-03 DIAGNOSIS — R519 Headache, unspecified: Secondary | ICD-10-CM

## 2013-09-03 DIAGNOSIS — G894 Chronic pain syndrome: Secondary | ICD-10-CM

## 2013-09-03 DIAGNOSIS — R51 Headache: Secondary | ICD-10-CM

## 2013-09-03 DIAGNOSIS — J449 Chronic obstructive pulmonary disease, unspecified: Secondary | ICD-10-CM

## 2013-09-03 DIAGNOSIS — G43909 Migraine, unspecified, not intractable, without status migrainosus: Secondary | ICD-10-CM | POA: Insufficient documentation

## 2013-09-03 DIAGNOSIS — G5600 Carpal tunnel syndrome, unspecified upper limb: Secondary | ICD-10-CM

## 2013-09-03 DIAGNOSIS — F419 Anxiety disorder, unspecified: Secondary | ICD-10-CM

## 2013-09-03 NOTE — Progress Notes (Signed)
  PATIENT: Caitlin Moore DOB: 01/02/1961  HISTORICAL  Caitlin Moore  is a 52 years old right-handed Caucasian female, referred by her Primary Care physician Dr. Corrington for evaluation of bilateral hands paresthesia     She had a past medical history of hypertension, hyperlipidemia, migraine, anxiety, previous lower back decompression surgery for left lumbar radiculopathy, she has been on disability since 2004 because of her chronic low back pain, she also reported a history of left first rib resection surgery in the early 1990s, she presented with left 4th and 5th finger paresthesia, left hand weakness,   surgery did help her symptoms   She complains of bilateral hands paresthesia involving all 5 fingers, for 2 years,    getting worse over the past 6 months, right worse than left, she has frequent right first 4 fingers numbness, she enjoys painting, playing video games, she has to shake her hands  to make her sensation comes back . She has constant left shoulder numbness from previous left first rib resection surgery She complains of mild gait difficulty due to left heel pain,    REVIEW OF SYSTEMS: Full 14 system review of systems performed and notable only for right heel pain, bilateral hands paresthesia  ALLERGIES: Allergies  Allergen Reactions  . Cymbalta [Duloxetine Hcl]   . Lyrica [Pregabalin]   . Methadone   . Paroxetine   . Paxil [Paroxetine Hcl]   . Propoxyphene N-Acetaminophen   . Sertraline Hcl   . Zoloft [Sertraline Hcl]     HOME MEDICATIONS: No current outpatient prescriptions on file prior to visit.   No current facility-administered medications on file prior to visit.    PAST MEDICAL HISTORY: Past Medical History  Diagnosis Date  . Carpal tunnel syndrome   . Chronic pain disorder   . Anxiety   . COPD (chronic obstructive pulmonary disease)     PAST SURGICAL HISTORY: Past Surgical History  Procedure Laterality Date  . Cataract extraction  Bilateral   . Back surgery      FAMILY HISTORY: Family History  Problem Relation Age of Onset  . Stroke    . Migraines    . Brain tumor Father   . Lung cancer Mother     SOCIAL HISTORY:  History   Social History  . Marital Status: Single    Spouse Name: N/A    Number of Children: 0  . Years of Education: 12   Occupational History    Disabled from low back pain   Social History Main Topics  . Smoking status: Former Smoker  . Smokeless tobacco: Never Used     Comment: Quit 2009  . Alcohol Use: No  . Drug Use: No  . Sexual Activity: Not on file   Other Topics Concern  . Not on file   Social History Narrative   Patient is single    Disabled   Right handed   High school education   Caffeine eight cups daily      PHYSICAL EXAM   Filed Vitals:   09/03/13 1204  BP: 158/90  Pulse: 71  Height: 5' 3" (1.6 m)  Weight: 262 lb (118.842 kg)    Not recorded    Body mass index is 46.42 kg/(m^2).   Generalized: In no acute distress  Neck: Supple, no carotid bruits   Cardiac: Regular rate rhythm  Pulmonary: Clear to auscultation bilaterally  Musculoskeletal: No deformity  Neurological examination  Mentation: Alert oriented to time, place, history taking, and causual   conversation  Cranial nerve II-XII: Pupils were equal round reactive to light. Extraocular movements were full.  Visual field were full on confrontational test. Bilateral fundi were sharp.  Facial sensation and strength were normal. Hearing was intact to finger rubbing bilaterally. Uvula tongue midline.  Head turning and shoulder shrug and were normal and symmetric.Tongue protrusion into cheek strength was normal.  Motor: She has giveaway weakness in left shoulder abduction, external rotation, grip, left leg, left ankle dorsiflexion,  Sensory: Intact to fine touch, pinprick, preserved vibratory sensation, and proprioception at toes, With exception of decreased pinprick at fingerpads    Coordination: Normal finger to nose, heel-to-shin bilaterally there was no truncal ataxia  Gait: Rising up from seated position without assistance,  she tends toward with right tiptoe complains of right heel pain when bearing weight, , without trunk ataxia, moderate stride, good arm swing, smooth turning,  Deep tendon reflexes: Brachioradialis 2/2, biceps 2/2, triceps 2/2, patellar 2/2, Achilles 2/2, plantar responses were flexor bilaterally.   DIAGNOSTIC DATA (LABS, IMAGING, TESTING) - I reviewed patient records, labs, notes, testing and imaging myself where available.  Lab Results  Component Value Date   WBC 5.2 10/14/2010   HGB 14.6 10/14/2010   HCT 44.8 10/14/2010   MCV 92.0 10/14/2010   PLT 292 10/14/2010      Component Value Date/Time   NA 137 10/14/2010 0745   K 3.4* 10/14/2010 0745   CL 101 10/14/2010 0745   CO2 26 10/14/2010 0745   GLUCOSE 131* 10/14/2010 0745   BUN 7 10/14/2010 0745   CREATININE 0.94 10/14/2010 0745   CALCIUM 8.8 10/14/2010 0745   PROT 7.4 10/14/2010 0745   ALBUMIN 3.5 10/14/2010 0745   AST 37 10/14/2010 0745   ALT 44* 10/14/2010 0745   ALKPHOS 70 10/14/2010 0745   BILITOT 0.5 10/14/2010 0745   GFRNONAA >60 10/14/2010 0745   GFRAA  Value: >60        The eGFR has been calculated using the MDRD equation. This calculation has not been validated in all clinical situations. eGFR's persistently <60 mL/min signify possible Chronic Kidney Disease. 10/14/2010 0745    ASSESSMENT AND PLAN  [name] is a 53 y.o. female complains of  bilateral hands paresthesia, right worse than left, on examination, she has giveaway weakness on the left arm, and the left leg, there was decreased pinprick at bilateral finger pads  1 most likely due to carpal tunnel syndrome 2 EMG nerve conduction study 3 laboratory evaluations.  Marcial Pacas, M.D. Ph.D.  [facility] [address], [place], [place] [address] [phone]

## 2013-09-04 LAB — COMPREHENSIVE METABOLIC PANEL
A/G RATIO: 1.5 (ref 1.1–2.5)
ALK PHOS: 80 IU/L (ref 39–117)
ALT: 39 IU/L — AB (ref 0–32)
AST: 28 IU/L (ref 0–40)
Albumin: 4.2 g/dL (ref 3.5–5.5)
BUN / CREAT RATIO: 15 (ref 9–23)
BUN: 13 mg/dL (ref 6–24)
CHLORIDE: 102 mmol/L (ref 97–108)
CO2: 25 mmol/L (ref 18–29)
Calcium: 9.6 mg/dL (ref 8.7–10.2)
Creatinine, Ser: 0.89 mg/dL (ref 0.57–1.00)
GFR, EST AFRICAN AMERICAN: 87 mL/min/{1.73_m2} (ref >59)
GFR, EST NON AFRICAN AMERICAN: 75 mL/min/{1.73_m2} (ref >59)
GLUCOSE: 134 mg/dL — AB (ref 65–99)
Globulin, Total: 2.8 g/dL (ref 1.5–4.5)
POTASSIUM: 5.1 mmol/L (ref 3.5–5.2)
SODIUM: 144 mmol/L (ref 134–144)
TOTAL PROTEIN: 7 g/dL (ref 6.0–8.5)

## 2013-09-04 LAB — THYROID PANEL WITH TSH
FREE THYROXINE INDEX: 1.7 (ref 1.2–4.9)
T3 Uptake Ratio: 22 % — ABNORMAL LOW (ref 24–39)
T4, Total: 7.6 ug/dL (ref 4.5–12.0)
TSH: 4.28 u[IU]/mL (ref 0.450–4.500)

## 2013-09-04 LAB — VITAMIN B12: Vitamin B-12: 385 pg/mL (ref 211–946)

## 2013-09-04 LAB — HGB A1C W/O EAG: Hgb A1c MFr Bld: 6.8 % — ABNORMAL HIGH (ref 4.8–5.6)

## 2013-09-20 ENCOUNTER — Encounter (INDEPENDENT_AMBULATORY_CARE_PROVIDER_SITE_OTHER): Payer: Self-pay

## 2013-09-20 ENCOUNTER — Ambulatory Visit (INDEPENDENT_AMBULATORY_CARE_PROVIDER_SITE_OTHER): Payer: Medicare Other | Admitting: Neurology

## 2013-09-20 DIAGNOSIS — G5600 Carpal tunnel syndrome, unspecified upper limb: Secondary | ICD-10-CM

## 2013-09-20 DIAGNOSIS — G894 Chronic pain syndrome: Secondary | ICD-10-CM

## 2013-09-20 DIAGNOSIS — Z0289 Encounter for other administrative examinations: Secondary | ICD-10-CM

## 2013-09-20 NOTE — Procedures (Signed)
   NCS (NERVE CONDUCTION STUDY) WITH EMG (ELECTROMYOGRAPHY) REPORT   STUDY DATE: March 5th 2015 PATIENT NAME: Caitlin Moore DOB: Jan 18, 1961 MRN: [id number]    TECHNOLOGIST: Gearldine ShownLorraine Jones ELECTROMYOGRAPHER: Caitlin Moore, Caitlin Moore M.D.  CLINICAL INFORMATION:  53 years old right-handed Caucasian female, presenting with 2 years history of intermittent bilateral fingertips paresthesia, right worse than left, mainly involving the first 4 fingers.  FINDINGS: NERVE CONDUCTION STUDY: Right median sensory showed mildly prolonged peak latency, was normal SNAP amplitude. Right median motor responses showed mildly prolonged distal latency, with normal C. map amplitude, and conduction velocity.  Bilateral ulnar sensory and motor responses were normal.  Left median sensory and motor responses were normal   NEEDLE ELECTROMYOGRAPHY: Selected needle examination was performed at right upper extremity muscles, right cervical paraspinal muscles.  Needle examination of right abductor pollicis brevis, pronator teres, biceps, triceps, deltoid, extensor digitorum communis was normal  There was no spontaneous activity at right cervical paraspinal muscles, right C5, 6, 7  IMPRESSION:   This is an abnormal study. There is electrodiagnostic evidence of right median neuropathy across the wrist, consistent with moderate right carpal tunnel syndrome. There is no evidence of left carpal tunnel syndromes, or right cervical radiculopathy. I have suggested her wrist splint, when necessary NSAIDs,    INTERPRETING PHYSICIAN:   Caitlin Moore, Caitlin Moore M.D. Ph.D. [facility] [address], [address] [place], [place] [address] [phone]

## 2013-11-06 ENCOUNTER — Other Ambulatory Visit: Payer: Self-pay | Admitting: Obstetrics and Gynecology

## 2014-02-01 ENCOUNTER — Other Ambulatory Visit: Payer: Self-pay | Admitting: Otolaryngology

## 2014-02-01 DIAGNOSIS — J018 Other acute sinusitis: Secondary | ICD-10-CM

## 2014-02-06 ENCOUNTER — Ambulatory Visit
Admission: RE | Admit: 2014-02-06 | Discharge: 2014-02-06 | Disposition: A | Discharge status: Home / Self Care | Payer: Medicare Other | Source: Ambulatory Visit | Attending: Otolaryngology | Admitting: Otolaryngology

## 2014-02-06 DIAGNOSIS — J018 Other acute sinusitis: Secondary | ICD-10-CM

## 2015-02-27 ENCOUNTER — Encounter: Payer: Self-pay | Admitting: Sports Medicine

## 2015-02-27 ENCOUNTER — Ambulatory Visit (INDEPENDENT_AMBULATORY_CARE_PROVIDER_SITE_OTHER): Payer: Medicare Other | Admitting: Sports Medicine

## 2015-02-27 VITALS — BP 134/84 | Ht 63.0 in | Wt 253.0 lb

## 2015-02-27 DIAGNOSIS — M2141 Flat foot [pes planus] (acquired), right foot: Secondary | ICD-10-CM | POA: Diagnosis present

## 2015-02-27 DIAGNOSIS — M76822 Posterior tibial tendinitis, left leg: Secondary | ICD-10-CM

## 2015-02-27 DIAGNOSIS — M5416 Radiculopathy, lumbar region: Secondary | ICD-10-CM

## 2015-02-27 DIAGNOSIS — M2142 Flat foot [pes planus] (acquired), left foot: Secondary | ICD-10-CM | POA: Diagnosis not present

## 2015-02-27 NOTE — Progress Notes (Signed)
  Caitlin Moore - 54 y.o. female MRN [id number]  Date of birth: [birth year] Caitlin Moore is a 54 y.o. female who presents today for left midfoot pain plantar aspect. Referrred courtesy of Dr Salvadore Farber  Left foot plantar mid foot pain, initial visit-patient states this is been ongoing now for about 12 years since she had an underlying L3/L4 herniated nucleus pulposis. She has since had underlying severe lumbar radiculopathy which is being followed by neurology.  She previously had a lumbar fusion and this decompression at L3/L4 done in 2012 due to underlying spinal stenosis and HNP. Since her underlying issue about 12 years ago she has been toeing off on the left foot and has underlying foot pain secondary to this. Anytime she attempts to walk on the calcaneus on the left foot she ends up with severe paresthesias and dysesthesias of the left leg that radiates into her gluteal region. She denies any bowel or bladder issues and denies any night sweats or chills. She has not tried anything to help correct the left foot pain. Pain is worse with any type of ambulation or at the end of the day. She did previously work as a Naval architect, Scientist, physiological and heavy labor but she is not doing any of that work currently.  PMHx - Updated and reviewed.  Contributory factors include: Chronic pain syndrome, lumbar radiculopathy. PSHx - Updated and reviewed.  Contributory factors include:  Lumbar fusion with disc decompression in 2012. FHx - Updated and reviewed.  Contributory factors include:  Noncontributory Medications - morphine extended release, Valium, Celebrex, Vicodin.  ROS Per HPI   Exam:  Filed Vitals:   02/27/15 1127  BP: 134/84   Gen: NAD Cardiorespiratory - Normal respiratory effort/rate. Ankle: No visible erythema or swelling. Range of motion is full in all directions. Strength is 5/5 in all directions. Stable lateral and medial ligaments; Talar dome nontender; No pain at base of 5th MT; No  tenderness over cuboid; No tenderness over N spot or navicular prominence No tenderness on posterior aspects of lateral and medial malleolus No sign of peroneal tendon subluxations; positive posterior tibial dysfunction on the left. Negative tarsal tunnel tinel's  Gait: Normal swing phase bilateral. Abnormal stance and pushoff phase on the left with no heel strike and  inability to completely plantar flex the left foot. She has a functional pes planus that is worse in the left compared to the right.  Imaging:  None obtained

## 2015-02-27 NOTE — Assessment & Plan Note (Signed)
Some correction with insoles  May add more with custom orthotics

## 2015-02-27 NOTE — Assessment & Plan Note (Addendum)
Secondary to underlying lumbar radiculopathy causing a plantar flexion contracture with gait. This is causing abnormal inversion stretching of the posterior tibialis on the left side. Which is giving her the plantar aspect mid foot pain. -We will insert a heel lift 5/16 inch and green insoles to help offset some of this. -She will be fitted with custom orthotics in about 3-4 weeks if this helps correct her underlying deformity.   After placemnt of heel lift and insole patient could walk with much more norm pushoff She felt signficiant pain relief

## 2015-04-02 ENCOUNTER — Ambulatory Visit (INDEPENDENT_AMBULATORY_CARE_PROVIDER_SITE_OTHER): Payer: Medicare Other | Admitting: Sports Medicine

## 2015-04-02 ENCOUNTER — Encounter: Payer: Self-pay | Admitting: Sports Medicine

## 2015-04-02 VITALS — BP 132/78 | Ht 63.0 in | Wt 242.0 lb

## 2015-04-02 DIAGNOSIS — M76822 Posterior tibial tendinitis, left leg: Secondary | ICD-10-CM | POA: Diagnosis present

## 2015-04-02 DIAGNOSIS — M25511 Pain in right shoulder: Secondary | ICD-10-CM | POA: Diagnosis not present

## 2015-04-02 NOTE — Progress Notes (Signed)
Caitlin Moore - 54 y.o. female MRN [id number]  Date of birth: Nov 02, 1960  CC: No chief complaint on file.   SUBJECTIVE:   HPI   - Referred by Dr. Salvadore Farber for b/l foot pain.  - Insoles made last month, 02/27/2015 -- Right feels a little tight on the right.   --Left helps quite a bit.  - She has not been able to put weight on the heel of the left foot for some time.   Right shoulder pain: right handed - 1 month of pain, aching. Less worse for a 1 year - Using lidocaine patches, which helps a little. - takes hydrocodone and  MS Contin.   - Trouble doing overhead movements. - Trouble lifting milk jug of milk - no falls - Not working at the moment.  - Active.   - Notices a non-painful click    ROS:     14 point RoS negative other than listed in hpi.   HISTORY: Past Medical, Surgical, Social, and Family History Reviewed & Updated per EMR.  Pertinent Historical Findings include: no changes since last visit.   OBJECTIVE: There were no vitals taken for this visit.  Physical Exam  Cardiorespiratory - Normal respiratory effort/rate. Ankle: No visible erythema or swelling. Range of motion is full in all directions. Strength is 5/5 in all directions. Stable lateral and medial ligaments; Talar dome nontender; No pain at base of 5th MT; No tenderness over cuboid; No tenderness over N spot or navicular prominence No tenderness on posterior aspects of lateral and medial malleolus No sign of peroneal tendon subluxations; positive posterior tibial dysfunction b/l, L>R.  Negative tarsal tunnel tinel's  Gait: Normal swing phase bilateral. Abnormal stance and pushoff phase on the left with no heel strike and inability to completely plantar flex the left foot. She has a functional pes planus, L>R.    Patient was fitted for a : standard, cushioned, semi-rigid orthotic. The orthotic was heated and afterward the patient stood on the orthotic blank positioned on the orthotic  stand. The patient was positioned in subtalar neutral position and 10 degrees of ankle dorsiflexion in a weight bearing stance. After completion of molding, a stable base was applied to the orthotic blank. The blank was ground to a stable position for weight bearing. Size:9 Base: Blue EVA Posting: none Additional orthotic padding:none  45 minutes was spent with the patient, more than 1/2 of which was spent in direct fabrication and construction of the orthotic.   MEDICATIONS, LABS & OTHER ORDERS: Previous Medications   CELECOXIB (CELEBREX) 200 MG CAPSULE    Take 200 mg by mouth 2 (two) times daily.   DIAZEPAM (VALIUM) 10 MG TABLET    Take 10 mg by mouth every 6 (six) hours as needed for anxiety.   DOXYCYCLINE (DORYX) 100 MG DR CAPSULE    Take 100 mg by mouth 2 (two) times daily.   ESOMEPRAZOLE (NEXIUM) 40 MG CAPSULE    Take 40 mg by mouth daily at 12 noon.   FEXOFENADINE (ALLEGRA ALLERGY) 180 MG TABLET    Take 180 mg by mouth daily.   FLUTICASONE-SALMETEROL (ADVAIR DISKUS) 500-50 MCG/DOSE AEPB    Inhale 1 puff into the lungs 2 (two) times daily.   HYDROCODONE-ACETAMINOPHEN (NORCO) 10-325 MG PER TABLET    Take 1 tablet by mouth every 6 (six) hours as needed.   HYDROXYZINE (ATARAX/VISTARIL) 25 MG TABLET    Take 25 mg by mouth 3 (three) times daily as needed.   LEVALBUTEROL [facility]  HFA) 45 MCG/ACT INHALER    Inhale into the lungs every 4 (four) hours as needed for wheezing.   LIDOCAINE (LIDODERM) 5 %    Place 1 patch onto the skin daily. Remove & Discard patch within 12 hours or as directed by MD   MOMETASONE (NASONEX) 50 MCG/ACT NASAL SPRAY    Place 2 sprays into the nose daily.   RANITIDINE (ZANTAC) 300 MG TABLET    Take 300 mg by mouth at bedtime.   RIZATRIPTAN (MAXALT) 10 MG TABLET    Take 10 mg by mouth as needed for migraine. May repeat in 2 hours if needed   SIMVASTATIN (ZOCOR) 20 MG TABLET    Take 20 mg by mouth daily.   SUMATRIPTAN (IMITREX) 5 MG/ACT NASAL SPRAY    Place 1 spray  into the nose every 2 (two) hours as needed for migraine.   TRIAMCINOLONE CREAM (KENALOG) 0.5 %    Apply 1 application topically 3 (three) times daily.   VALSARTAN (DIOVAN) 160 MG TABLET    Take 160 mg by mouth daily.   Modified Medications   No medications on file   New Prescriptions   No medications on file   Discontinued Medications   No medications on file  No orders of the defined types were placed in this encounter.   ASSESSMENT & PLAN: See problem based charting & AVS for pt instructions.

## 2015-04-03 DIAGNOSIS — M25511 Pain in right shoulder: Secondary | ICD-10-CM | POA: Insufficient documentation

## 2015-04-03 NOTE — Assessment & Plan Note (Signed)
Caitlin Moore did very well last month with her insoles that included scaphoid pads b/l and a heel lift on the left.  - She reported good cushion and comfort with the orthotics. No adjustments required.   - Gait improved post orthotics.  - f/u PRN.

## 2015-04-03 NOTE — Assessment & Plan Note (Signed)
Chronic right shoulder pain.  Will make f/u as there was not time today to discuss her shoulder with orthotic construction.

## 2015-04-08 ENCOUNTER — Ambulatory Visit
Admission: RE | Admit: 2015-04-08 | Discharge: 2015-04-08 | Disposition: A | Discharge status: Home / Self Care | Payer: Medicare Other | Source: Ambulatory Visit | Attending: Family Medicine | Admitting: Family Medicine

## 2015-04-08 ENCOUNTER — Encounter: Payer: Self-pay | Admitting: Family Medicine

## 2015-04-08 ENCOUNTER — Ambulatory Visit (INDEPENDENT_AMBULATORY_CARE_PROVIDER_SITE_OTHER): Payer: Medicare Other | Admitting: Family Medicine

## 2015-04-08 VITALS — BP 140/100 | HR 88 | Ht 63.0 in | Wt 245.0 lb

## 2015-04-08 DIAGNOSIS — M7061 Trochanteric bursitis, right hip: Secondary | ICD-10-CM | POA: Diagnosis not present

## 2015-04-08 DIAGNOSIS — M25511 Pain in right shoulder: Secondary | ICD-10-CM

## 2015-04-08 DIAGNOSIS — M25551 Pain in right hip: Secondary | ICD-10-CM

## 2015-04-08 MED ORDER — METHYLPREDNISOLONE ACETATE 40 MG/ML IJ SUSP
40.0000 mg | Freq: Once | INTRAMUSCULAR | Status: AC
  Administered 2015-04-08: 40 mg via INTRA_ARTICULAR

## 2015-04-08 NOTE — Progress Notes (Signed)
Caitlin Moore - 54 y.o. female MRN [id number]  Date of birth: December 21, 1960  CC:   SUBJECTIVE:   HPI  Right shoulder pain: - Pain for last 6 months, worsening. - Pain with most overhead movement. - On numerous chronic pain medications. No antiinflammatories - No previous injuries, falls - No previous issues with her right shoulder. - No injections into the right shoulder. - No radiation of pain from her neck. No weakness or tingling  Right lateral hip pain. Patient with 3 months of lateral hip discomfort. Pain is most uncomfortable when walking. Patient denies any trauma or bruising. She denies any fevers chills or night sweats. She's been trying to do exercises at home but is uncomfortable.  Doing well with orthotics. Able to walk to mailbox now.   ROS:     10 point review of systems negative other than that listed in history of present illness.  HISTORY: Past Medical, Surgical, Social, and Family History Reviewed & Updated per EMR.  Pertinent Historical Findings include: No changes since her last visit.  OBJECTIVE: BP 140/100 mmHg  Pulse 88  Ht  (1.6 m)  Wt 245 lb (111.131 kg)  BMI 43.41 kg/m2  Physical Exam  Alert and oriented 3, no acute distress, smiling Nonlabored breathing, speaking in complete sentences without distress.  Shoulder: Right Inspection reveals no abnormalities, atrophy or asymmetry. Palpation is normal with with tenderness over the [facility] joint and anterior shoulder/ subacromial space.  Active ROM is limited with forward flexion and abduction to 100 and 120. She is able to get full range of motion passively, although painful Rotator cuff strength normal throughout. She does have some pain with empty can testing as well as resisted internal rotation + signs of impingement with positive Neer and Hawkin's tests, empty can. Speeds and Yergason's tests relatively normal No labral pathology noted with negative Obrien's, negative clunk and good stability. +  painful arc and no drop arm sign. No apprehension sign AC joint is tender to palpation. Positive crossarm  Hip: Right ROM IR: 80 Deg, ER: 80 Deg, Flexion: 120 Deg, Extension: 100 Deg, Abduction: 45 Deg, Adduction: 45 Deg Strength IR: 5/5, ER: 5/5, Flexion: 4/5, Extension: 5/5, Abduction: 4/5, Adduction: 5/5 Greater trochanter tender to palpation. No tenderness over piriformis. No SI joint tenderness.  Imaging: [place] image of the R shoulder in both long and short axis obtained. Exam is difficult secondary to body habitus. Carotid able to do full exam due to time constraints. Biceps tendon appears normal fibrillar pattern without surrounding effusion. Subscapularis appears normal without obvious tears or abnormalities. The supraspinatus tendon appears to have a 50% partial tear on the bursal side.  T The AC joint appears to have limited joint space and minimal spurring, although no significant effusion identified.  Overall the ultrasound is not of the best quality. 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 Sterile betadine prep. Furthur cleansed with alcohol. Topical analgesic spray: Ethyl chloride. Joint: Right subacromial Approached in typical fashion with: Posteriolateral Completed without difficulty Meds: 2:4 of 40 mg/mL of Solu-Medrol and 1% Xylocaine Needle: 1.5" x 25-gauge Aftercare instructions and Red flags advised.  Consent obtained and verified. Sterile betadine prep. Furthur cleansed with alcohol. Topical analgesic spray: Ethyl chloride. Joint: Right greater trochanter Approached in typical fashion with: Lateral, landmark guided Completed without difficulty Meds:1:5 of 40 mg/mL of Solu-Medrol and 1% Xylocaine Needle: 25" x 22-gauge Aftercare instructions and Red  flags advised.  MEDICATIONS, LABS & OTHER ORDERS: Previous Medications   AMPHETAMINE-DEXTROAMPHETAMINE (ADDERALL)  10 MG TABLET    Take 10 mg by mouth.   BUDESONIDE-FORMOTEROL (SYMBICORT) 160-4.5 MCG/ACT INHALER    INHALE 2 PUFFS BY MOUTH TWICE DAILY   CELECOXIB (CELEBREX) 200 MG CAPSULE    Take 200 mg by mouth 2 (two) times daily.   DEXLANSOPRAZOLE (DEXILANT) 60 MG CAPSULE    TAKE 1 CAPSULE BY MOUTH EVERY DAY   DIAZEPAM (VALIUM) 10 MG TABLET    Take 10 mg by mouth every 6 (six) hours as needed for anxiety.   DOXYCYCLINE (DORYX) 100 MG DR CAPSULE    Take 100 mg by mouth 2 (two) times daily.   DOXYCYCLINE (VIBRAMYCIN) 100 MG CAPSULE       ESOMEPRAZOLE (NEXIUM) 40 MG CAPSULE    Take 40 mg by mouth daily at 12 noon.   HYDROCODONE-ACETAMINOPHEN (NORCO) 10-325 MG PER TABLET    Take 1 tablet by mouth every 6 (six) hours as needed.   HYDROXYZINE (ATARAX/VISTARIL) 25 MG TABLET    Take 25 mg by mouth 3 (three) times daily as needed.   LEVALBUTEROL (XOPENEX HFA) 45 MCG/ACT INHALER    Inhale into the lungs every 4 (four) hours as needed for wheezing.   LIDOCAINE (LIDODERM) 5 %    Place 1 patch onto the skin daily. Remove & Discard patch within 12 hours or as directed by MD   MOMETASONE (NASONEX) 50 MCG/ACT NASAL SPRAY    Place 2 sprays into the nose daily.   MONTELUKAST (SINGULAIR) 10 MG TABLET    TAKE 1 TABLET BY MOUTH EVERY NIGHT AT BEDTIME   MORPHINE (MS CONTIN) 60 MG 12 HR TABLET    Take 2 tablets in the am, one at noon, and 2 in the pm   RANITIDINE (ZANTAC) 300 MG TABLET    Take 300 mg by mouth at bedtime.   RIZATRIPTAN (MAXALT) 10 MG TABLET    Take 10 mg by mouth as needed for migraine. May repeat in 2 hours if needed   SIMVASTATIN (ZOCOR) 20 MG TABLET    Take 20 mg by mouth daily.   SUMATRIPTAN (IMITREX) 5 MG/ACT NASAL SPRAY    Place 1 spray into the nose every 2 (two) hours as needed for migraine.   TIOTROPIUM BROMIDE MONOHYDRATE (SPIRIVA RESPIMAT) 2.5 MCG/ACT AERS    INHALE 1 PUFF BY MOUTH ONCE A DAY   TRIAMCINOLONE CREAM (KENALOG) 0.5 %    Apply 1 application topically 3 (three) times daily.   VALSARTAN  (DIOVAN) 160 MG TABLET    Take 160 mg by mouth daily.   Modified Medications   No medications on file   New Prescriptions   No medications on file   Discontinued Medications   No medications on file  No orders of the defined types were placed in this encounter.   ASSESSMENT & PLAN: See problem based charting & AVS for pt instructions.

## 2015-04-09 DIAGNOSIS — M7061 Trochanteric bursitis, right hip: Secondary | ICD-10-CM | POA: Insufficient documentation

## 2015-04-09 NOTE — Assessment & Plan Note (Signed)
A pleasant 54 year old with 6 months of right shoulder pain. No trauma. Ultrasound is limited due to body habitus but appears to show a partial supraspinatus tear. She does appear to have intact strength of the SS, but does have pain with resistance. Additionally she does have a narrowed before meals joint and is tender over that joint. I'm not certain what percentage of her pain is coming from this versus the SS tear. - Subacromial injection performed today  - Rotator cuff exercises described and demonstrated - X-ray to evaluate for pathology - Follow up consider before meals joint injection - Follow up in 4-6 weeks

## 2015-04-09 NOTE — Assessment & Plan Note (Signed)
Patient with apparent trochanteric bursitis on the right side. This may have been worsened overall by her chronic foot pain that is improved since taking orthotics last week. Flu with an improved gait she'll have normal stride and less irritation of her trochanteric bursa. We did a 1:5 injection today. She had weak hip abductor and flexors. - Injection performed - Given handout for hip strengthening exercises and stretching -Follow up in 4-6 weeks

## 2015-05-13 ENCOUNTER — Encounter: Payer: Self-pay | Admitting: Family Medicine

## 2015-05-13 ENCOUNTER — Ambulatory Visit (INDEPENDENT_AMBULATORY_CARE_PROVIDER_SITE_OTHER): Payer: Medicare Other | Admitting: Family Medicine

## 2015-05-13 VITALS — BP 145/66 | HR 71 | Ht 63.0 in | Wt 245.0 lb

## 2015-05-13 DIAGNOSIS — M76822 Posterior tibial tendinitis, left leg: Secondary | ICD-10-CM | POA: Diagnosis not present

## 2015-05-13 DIAGNOSIS — S76911A Strain of unspecified muscles, fascia and tendons at thigh level, right thigh, initial encounter: Secondary | ICD-10-CM | POA: Diagnosis not present

## 2015-05-13 DIAGNOSIS — M7061 Trochanteric bursitis, right hip: Secondary | ICD-10-CM

## 2015-05-13 DIAGNOSIS — M25511 Pain in right shoulder: Secondary | ICD-10-CM | POA: Diagnosis not present

## 2015-05-13 NOTE — Progress Notes (Signed)
Caitlin Moore - 54 y.o. female MRN [id number]  Date of birth: 1960-08-06  CC: No chief complaint on file.   SUBJECTIVE:   HPI  Right hip pain:  - Pain began improving after 1 week following trochanteric bursa injection. .   - Exacerbated this weekend when doing yard work.  This pain was more in the glute.   - Long history, ~ 1 year, of anterior hip pain consistent with muscular strain of hip flexors/adductors.   - She denies any fevers chills or night sweats.  - She's been trying to do her HEP  But it is uncomfortable.  Right Shoulder:  - 100% pain resolution with AC injection last month.  - Doing RC exercises every other day.  Up to 25 reps at a time.  - Occasional clicking, but non-painful     ROS:     10 point review of systems negative other than that listed in history of present illness.  HISTORY: Past Medical, Surgical, Social, and Family History Reviewed & Updated per EMR.    OBJECTIVE: BP 145/66 mmHg  Pulse 71  Ht  (1.6 m)  Wt 245 lb (132.440 kg)  BMI 43.41 kg/m2  Physical Exam Alert and oriented 3, no acute distress, smiling Nonlabored breathing, speaking in complete sentences without distress.  Shoulder: Right Inspection reveals no abnormalities, atrophy or asymmetry. Palpation is normal with with tenderness over the [facility] joint and anterior shoulder/ subacromial space.  Full Active ROM. No pain Rotator cuff strength normal throughout.  - signs of impingement with positive Neer and Hawkin's tests, empty can. Speeds and Yergason's tests relatively normal No labral pathology noted with negative Obrien's, negative clunk and good stability. - painful arc and no drop arm sign. No apprehension sign AC joint is tender to palpation. Positive crossarm  Hip: Right ROM IR: 80 Deg, ER: 80 Deg, Flexion: 120 Deg, Extension: 100 Deg, Abduction: 45 Deg, Adduction: 45 Deg Strength IR: 5/5, ER: 5/5, Flexion: 4/5 due to pain, Extension: 5/5, Abduction: 4/5, Adduction:  5/5 Greater trochanter non-tender to palpation. Minimal tenderness over piriformis. No SI joint tenderness.  MEDICATIONS, LABS & OTHER ORDERS: Previous Medications   AMPHETAMINE-DEXTROAMPHETAMINE (ADDERALL) 10 MG TABLET    Take 10 mg by mouth.   BUDESONIDE-FORMOTEROL (SYMBICORT) 160-4.5 MCG/ACT INHALER    INHALE 2 PUFFS BY MOUTH TWICE DAILY   CELECOXIB (CELEBREX) 200 MG CAPSULE    Take 200 mg by mouth 2 (two) times daily.   DEXLANSOPRAZOLE (DEXILANT) 60 MG CAPSULE    TAKE 1 CAPSULE BY MOUTH EVERY DAY   DIAZEPAM (VALIUM) 10 MG TABLET    Take 10 mg by mouth every 6 (six) hours as needed for anxiety.   DOXYCYCLINE (DORYX) 100 MG DR CAPSULE    Take 100 mg by mouth 2 (two) times daily.   DOXYCYCLINE (VIBRAMYCIN) 100 MG CAPSULE       ESOMEPRAZOLE (NEXIUM) 40 MG CAPSULE    Take 40 mg by mouth daily at 12 noon.   HYDROCODONE-ACETAMINOPHEN (NORCO) 10-325 MG PER TABLET    Take 1 tablet by mouth every 6 (six) hours as needed.   HYDROXYZINE (ATARAX/VISTARIL) 25 MG TABLET    Take 25 mg by mouth 3 (three) times daily as needed.   LEVALBUTEROL (XOPENEX HFA) 45 MCG/ACT INHALER    Inhale into the lungs every 4 (four) hours as needed for wheezing.   LIDOCAINE (LIDODERM) 5 %    Place 1 patch onto the skin daily. Remove & Discard patch within 12 hours  or as directed by MD   MOMETASONE (NASONEX) 50 MCG/ACT NASAL SPRAY    Place 2 sprays into the nose daily.   MONTELUKAST (SINGULAIR) 10 MG TABLET    TAKE 1 TABLET BY MOUTH EVERY NIGHT AT BEDTIME   MORPHINE (MS CONTIN) 60 MG 12 HR TABLET    Take 2 tablets in the am, one at noon, and 2 in the pm   RANITIDINE (ZANTAC) 300 MG TABLET    Take 300 mg by mouth at bedtime.   RIZATRIPTAN (MAXALT) 10 MG TABLET    Take 10 mg by mouth as needed for migraine. May repeat in 2 hours if needed   SIMVASTATIN (ZOCOR) 20 MG TABLET    Take 20 mg by mouth daily.   SUMATRIPTAN (IMITREX) 5 MG/ACT NASAL SPRAY    Place 1 spray into the nose every 2 (two) hours as needed for migraine.    TIOTROPIUM BROMIDE MONOHYDRATE (SPIRIVA RESPIMAT) 2.5 MCG/ACT AERS    INHALE 1 PUFF BY MOUTH ONCE A DAY   TRIAMCINOLONE CREAM (KENALOG) 0.5 %    Apply 1 application topically 3 (three) times daily.   VALSARTAN (DIOVAN) 160 MG TABLET    Take 160 mg by mouth daily.   Modified Medications   No medications on file   New Prescriptions   No medications on file   Discontinued Medications   No medications on file  No orders of the defined types were placed in this encounter.   ASSESSMENT & PLAN: See problem based charting & AVS for pt instructions.sd

## 2015-05-14 DIAGNOSIS — S76819A Strain of other specified muscles, fascia and tendons at thigh level, unspecified thigh, initial encounter: Secondary | ICD-10-CM | POA: Insufficient documentation

## 2015-05-14 DIAGNOSIS — S76919A Strain of unspecified muscles, fascia and tendons at thigh level, unspecified thigh, initial encounter: Secondary | ICD-10-CM | POA: Insufficient documentation

## 2015-05-14 NOTE — Assessment & Plan Note (Addendum)
Injected last month on 04/09/2015, now pain is resolved.  Refer to iliopsoas strain for today's evaluation.

## 2015-05-14 NOTE — Assessment & Plan Note (Signed)
Resolved with SA injectio last.  Partial thickness SS tear identified on u/s last month, but normal strength. She is doing her RC exercises religiously and will continue doing them every other day. She also has AC joint pathology, but, other than tenderness on exam and arthritic change on u/s, it does not seem to causing her much issue.  She will follow up as needed.

## 2015-05-14 NOTE — Assessment & Plan Note (Signed)
Very pleasant 54 yo female with chronic pain on morphine who presents in f/u for ongoing b/l hip, R>L, pain.  Trochanteric injection last month provided complete relief to that area.  She has been having trouble doing her HEP that was prescribed to help her strengthen her hip girdle due to pain with her hip flexors, which was confirmed on exam today.  No other origin of her pain could be identified. - We have provided her with a hip stretching regimen handout to perform regularly over the next month.  - Minimize strengthening at the moment as that seems to be doing more harm than good.  - We are also obtaining an XR to r/o any hip joint pathology, although her exam does not suggest this as the origin of her pain. -f/u 4-6 weeks to re-evaluate the hip.  Consider u/s at that time.

## 2015-05-16 ENCOUNTER — Ambulatory Visit
Admission: RE | Admit: 2015-05-16 | Discharge: 2015-05-16 | Disposition: A | Discharge status: Home / Self Care | Payer: Medicare Other | Source: Ambulatory Visit | Attending: Family Medicine | Admitting: Family Medicine

## 2015-05-16 DIAGNOSIS — M7061 Trochanteric bursitis, right hip: Secondary | ICD-10-CM

## 2015-05-19 ENCOUNTER — Telehealth: Payer: Self-pay | Admitting: *Deleted

## 2015-05-19 NOTE — Telephone Encounter (Signed)
-----   Message from Guinevere ScarletBlake Williams, MD sent at 05/19/2015  9:46 AM EDT ----- Eric FormHey Jess Sulak,  Would you tell Ms. Roller her XR of her hip joints look good.  She is to continue her exercises and we will see her back in 1 month.   Thanks!

## 2015-05-19 NOTE — Telephone Encounter (Signed)
Spoke with the patient about her xrays. She will continue exercises and see us at her follow up appt.

## 2015-06-17 ENCOUNTER — Encounter: Payer: Self-pay | Admitting: Family Medicine

## 2015-06-17 ENCOUNTER — Ambulatory Visit (INDEPENDENT_AMBULATORY_CARE_PROVIDER_SITE_OTHER): Payer: Medicare Other | Admitting: Family Medicine

## 2015-06-17 VITALS — BP 150/80 | Ht 63.0 in | Wt 245.0 lb

## 2015-06-17 DIAGNOSIS — M25511 Pain in right shoulder: Secondary | ICD-10-CM

## 2015-06-17 MED ORDER — METHYLPREDNISOLONE ACETATE 40 MG/ML IJ SUSP
40.0000 mg | Freq: Once | INTRAMUSCULAR | Status: AC
  Administered 2015-06-17: 40 mg via INTRA_ARTICULAR

## 2015-06-18 NOTE — Assessment & Plan Note (Addendum)
U/S and exam [facility] with [facility] and impingement. Discussed options and considering how well her SA injection worked 8 weeks ago, we elected to do this again. She reported significant relief of her pain following the injection.  She will continue to do her exercises, which she has faithfully done, but may back off to 1x/day.  We will see her back for her shoulder in 6-8 weeks or sooner regarding her other issues if necessary.

## 2015-06-18 NOTE — Progress Notes (Signed)
Caitlin Moore - 54 y.o. female MRN [id number]  Date of birth: [birth year]  CC: Right shoulder pain.   SUBJECTIVE:   HPI Right shoulder pain: - Previously performed SA injection on 04/08/2015 and had complete relief for 8 weeks.  - Pain returned several weeks ago. No acute injury since.  - Pain with most overhead movement and reaching around back. . - On numerous chronic pain medications. No antiinflammatories.  - No radiation of pain from her neck. No weakness or tingling.  - Has been doing RC exercises 'religiously'.   Right lateral hip pain: resolved following trochanteric injection.  She is still having hip flexor discomfort although it is minimal and she seems to be doing better since beginning her pelvic stabilizing muscle exercises last month.    ROS:     10 point review of systems negative other than that listed in history of present illness.  HISTORY: Past Medical, Surgical, Social, and Family History Reviewed & Updated per EMR.    OBJECTIVE: BP 150/80 mmHg  Ht  (1.6 m)  Wt 245 lb (111.131 kg)  BMI 43.41 kg/m2  Physical Exam   Alert and oriented 3, no acute distress, smiling Nonlabored breathing, speaking in complete sentences without distress.  Shoulder: Right Inspection reveals no abnormalities, atrophy or asymmetry. Palpation is normal with with tenderness over the [facility] joint and anterior shoulder/ subacromial space.  Full Active ROM. No pain Rotator cuff strength normal throughout.  - signs of impingement with positive Neer and Hawkin's tests, empty can. Speeds and Yergason's tests relatively normal No labral pathology noted with negative Obrien's and good stability. + painful arc and drop arm sign. No apprehension sign AC joint is tender to palpation. Positive crossarm  Imaging: [place] image of the R/L shoulder in both long and short axis obtained. Biceps tendon appears normal fibrillar pattern without surrounding effusion. Subscapularis appears normal  without obvious tears or abnormalities. The supraspinatus tendon appears normal with no tears and a good footprint.  The infraspinatus and teres minor tendons appear normal with no tears and a good footprints. The subdeltoid/subacromial bursa is unremarkable and shows no impingement with dynamic motion. The [facility] joint appears to have ample joint space with minimal spurring and moderate effusion is present.   Consent obtained and verified. Sterile betadine prep. Furthur cleansed with alcohol. Topical analgesic spray: Ethyl chloride. Joint: Right subacromial Approached in typical fashion with: Posteriolateral Completed without difficulty Meds: 2:4 of 40 mg/mL of Solu-Medrol and 1% Xylocaine Needle: 1.5" x 25-gauge Aftercare instructions and Red flags advised.  MEDICATIONS, LABS & OTHER ORDERS: Previous Medications   AMPHETAMINE-DEXTROAMPHETAMINE (ADDERALL) 10 MG TABLET    Take 10 mg by mouth.   AMPHETAMINE-DEXTROAMPHETAMINE (ADDERALL) 15 MG TABLET    TK 1 T PO BID   BUDESONIDE-FORMOTEROL (SYMBICORT) 160-4.5 MCG/ACT INHALER    INHALE 2 PUFFS BY MOUTH TWICE DAILY   CELECOXIB (CELEBREX) 200 MG CAPSULE    Take 200 mg by mouth 2 (two) times daily.   CELECOXIB (CELEBREX) 200 MG CAPSULE    TAKE 1 CAPSULE BY MOUTH EVERY DAY   DEXLANSOPRAZOLE (DEXILANT) 60 MG CAPSULE    TAKE 1 CAPSULE BY MOUTH EVERY DAY   DEXLANSOPRAZOLE (DEXILANT) 60 MG CAPSULE    TAKE 1 CAPSULE BY MOUTH EVERY DAY   DIAZEPAM (VALIUM) 10 MG TABLET    Take 10 mg by mouth every 6 (six) hours as needed for anxiety.   DIAZEPAM (VALIUM) 10 MG TABLET    Take 10 mg by mouth.  DOXYCYCLINE (DORYX) 100 MG DR CAPSULE    Take 100 mg by mouth 2 (two) times daily.   DOXYCYCLINE (VIBRAMYCIN) 100 MG CAPSULE       DOXYCYCLINE (VIBRAMYCIN) 100 MG CAPSULE    Take 100 mg by mouth.   ESOMEPRAZOLE (NEXIUM) 40 MG CAPSULE    Take 40 mg by mouth daily at 12 noon.   ESOMEPRAZOLE (NEXIUM) 40 MG CAPSULE    TAKE 1 CAPSULE BY MOUTH TWICE DAILY    HYDROCODONE-ACETAMINOPHEN (NORCO) 10-325 MG PER TABLET    Take 1 tablet by mouth every 6 (six) hours as needed.   HYDROCODONE-ACETAMINOPHEN (NORCO) 10-325 MG TABLET    Take 1 tablet by mouth.   HYDROXYZINE (ATARAX/VISTARIL) 25 MG TABLET    Take 25 mg by mouth 3 (three) times daily as needed.   HYDROXYZINE (ATARAX/VISTARIL) 25 MG TABLET    TAKE 1 TABLET BY MOUTH AS NEEDED FOR ITCHING   KETOROLAC (ACULAR) 0.4 % SOLN       KETOROLAC (ACULAR) 0.4 % SOLN       LEVALBUTEROL (XOPENEX HFA) 45 MCG/ACT INHALER    Inhale into the lungs every 4 (four) hours as needed for wheezing.   LIDOCAINE (LIDODERM) 5 %    Place 1 patch onto the skin daily. Remove & Discard patch within 12 hours or as directed by MD   MOMETASONE (NASONEX) 50 MCG/ACT NASAL SPRAY    Place 2 sprays into the nose daily.   MONTELUKAST (SINGULAIR) 10 MG TABLET    TAKE 1 TABLET BY MOUTH EVERY NIGHT AT BEDTIME   MONTELUKAST (SINGULAIR) 10 MG TABLET    TAKE 1 TABLET BY MOUTH EVERY NIGHT AT BEDTIME   MORPHINE (MS CONTIN) 60 MG 12 HR TABLET    Take 2 tablets in the am, one at noon, and 2 in the pm   MORPHINE (MS CONTIN) 60 MG 12 HR TABLET    Take 2 tablets in the am, one at noon, and 2 in the pm   OFLOXACIN (OCUFLOX) 0.3 % OPHTHALMIC SOLUTION       OFLOXACIN (OCUFLOX) 0.3 % OPHTHALMIC SOLUTION       PREDNISOLONE ACETATE (PRED FORTE) 1 % OPHTHALMIC SUSPENSION       PREDNISOLONE ACETATE (PRED FORTE) 1 % OPHTHALMIC SUSPENSION       RANITIDINE (ZANTAC) 300 MG TABLET    Take 300 mg by mouth at bedtime.   RANITIDINE (ZANTAC) 300 MG TABLET    TAKE 1 TABLET BY MOUTH EVERY NIGHT AT BEDTIME   RIZATRIPTAN (MAXALT) 10 MG TABLET    Take 10 mg by mouth as needed for migraine. May repeat in 2 hours if needed   SIMVASTATIN (ZOCOR) 20 MG TABLET    Take 20 mg by mouth daily.   SIMVASTATIN (ZOCOR) 20 MG TABLET    TAKE 1 TABLET BY MOUTH EVERY EVENING   SUMATRIPTAN (IMITREX) 5 MG/ACT NASAL SPRAY    Place 1 spray into the nose every 2 (two) hours as needed for  migraine.   SUMATRIPTAN 6 MG/0.5ML SOAJ    INJECT UNDER THE SKIN UTD   TIOTROPIUM BROMIDE MONOHYDRATE (SPIRIVA RESPIMAT) 2.5 MCG/ACT AERS    INHALE 1 PUFF BY MOUTH ONCE A DAY   TIOTROPIUM BROMIDE MONOHYDRATE (SPIRIVA RESPIMAT) 2.5 MCG/ACT AERS    INHALE 1 PUFF BY MOUTH EVERY DAY   TRIAMCINOLONE CREAM (KENALOG) 0.5 %    Apply 1 application topically 3 (three) times daily.   VALSARTAN (DIOVAN) 160 MG TABLET    Take 160 mg  by mouth daily.   VALSARTAN (DIOVAN) 160 MG TABLET    TAKE 1 TABLET BY MOUTH EVERY DAY   Modified Medications   No medications on file   New Prescriptions   No medications on file   Discontinued Medications   No medications on file  No orders of the defined types were placed in this encounter.   ASSESSMENT & PLAN: See problem based charting & AVS for pt instructions.

## 2015-09-09 ENCOUNTER — Encounter: Payer: Self-pay | Admitting: Family Medicine

## 2015-09-09 ENCOUNTER — Ambulatory Visit (INDEPENDENT_AMBULATORY_CARE_PROVIDER_SITE_OTHER): Payer: Medicare Other | Admitting: Family Medicine

## 2015-09-09 VITALS — BP 170/84 | Ht 63.0 in | Wt 245.0 lb

## 2015-09-09 DIAGNOSIS — M21619 Bunion of unspecified foot: Secondary | ICD-10-CM | POA: Diagnosis not present

## 2015-09-09 DIAGNOSIS — M79674 Pain in right toe(s): Secondary | ICD-10-CM | POA: Diagnosis not present

## 2015-09-09 DIAGNOSIS — M25511 Pain in right shoulder: Secondary | ICD-10-CM | POA: Diagnosis present

## 2015-09-09 MED ORDER — METHYLPREDNISOLONE ACETATE 40 MG/ML IJ SUSP
40.0000 mg | Freq: Once | INTRAMUSCULAR | Status: AC
  Administered 2015-09-09: 40 mg via INTRA_ARTICULAR

## 2015-09-10 NOTE — Progress Notes (Signed)
Caitlin Moore - 55 y.o. female MRN [id number]  Date of birth: June 20, 1961  CC: Right shoulder and right forefoot pain.   SUBJECTIVE:   HPI Right shoulder pain, recurrent: - She was last seen on 06/17/2015 and performed her second subacromial injection within a 3 month period. She has also had an before meals injection. - She had good relief of her pain following her last injection until a few weeks ago. She believes injections helped her for about 2 months.  - Pain with most overhead movement and reaching around back. . - On numerous chronic pain medications. No antiinflammatories.  - No radiation of pain from her neck. No weakness or tingling.  - Has been doing RC exercises 'religiously'.  - No new trauma - Recently her pain has been radiating to her distal right bicep on occasion.  Right forefoot pain: - This is been ongoing for several years. - She previously had orthotics made on 04/02/2015, which seem to be holding up well. - Her pain is primarily over the medial aspect of the proximal MTP joint. - She denies any swelling, redness, drainage. - She has tried different shoewear but it hurts regardless of what she does.  Right lateral hip pain: resolved   ROS:     10 point review of systems negative other than that listed in history of present illness.  HISTORY: Past Medical, Surgical, Social, and Family History Reviewed & Updated per EMR.  COPD, chronic pain  OBJECTIVE: BP 170/84 mmHg  Ht 5\' 3"  (1.6 m)  Wt 245 lb (111.131 kg)  BMI 43.41 kg/m2  Physical Exam   Alert and oriented 3, no acute distress, smiling Nonlabored breathing, speaking in complete sentences without distress.  Shoulder: Right Inspection reveals no abnormalities, atrophy or asymmetry. Palpation is normal with with tenderness over the [facility] joint and anterior shoulder/ subacromial space.  Full Active ROM. No pain Rotator cuff strength normal throughout.  +with empty can. Negative Neer and Hawkin's  . Speeds and Yergason's tests relatively normal No labral pathology noted with negative Obrien's and good stability. + painful arc and drop arm sign. No apprehension sign AC joint is tender to palpation. Positive crossarm  On left foot patient has mild  hallux valgus deformity of great toe and first MT: there is associated minmal loss of longitudinal arch: Loss of transverse archand subluxation through the capsule ; bunionette not present.  On right foot patient has mild hallux valgus deformity of great toe and first MT; there is associated loss of longitudinal arch and functional pes planus; Loss of transverse arch and subluxation through the capsule; bunionette not present. Freely mobile first MTP with negative grind  Gait: Normal swing phase bilateral. Abnormal stance and pushoff phase on the left with no heel strike and inability to completely plantar flex the left foot. She has a functional pes planus, L>R.   Ultrasound: Ultrasound of the right medial forefoot shows relatively well maintained joint space of the first MTP. There is bony irregularity and soft tissue change over the superior medial portion of the first metatarsal head.  Consent obtained and verified. Sterile betadine prep. Furthur cleansed with alcohol. Topical analgesic spray: Ethyl chloride. Joint: Bunion Approached in typical fashion with: Ultrasound guidance to a regular soft tissue overlying the superior medial aspect of the first metatarsal head. Completed without difficulty Meds: Half cc of methylprednisolone and 1% lidocaine Needle: 5/8 inch Aftercare instructions and Red flags advised.  MEDICATIONS, LABS & OTHER ORDERS: Previous Medications   AMPHETAMINE-DEXTROAMPHETAMINE (  ADDERALL) 10 MG TABLET    Take 10 mg by mouth.   AMPHETAMINE-DEXTROAMPHETAMINE (ADDERALL) 15 MG TABLET    TK 1 T PO BID   AMPHETAMINE-DEXTROAMPHETAMINE (ADDERALL) 15 MG TABLET    Take by mouth.   AZITHROMYCIN (ZITHROMAX) 250 MG TABLET        BUDESONIDE-FORMOTEROL (SYMBICORT) 160-4.5 MCG/ACT INHALER    INHALE 2 PUFFS BY MOUTH TWICE DAILY   BUDESONIDE-FORMOTEROL (SYMBICORT) 160-4.5 MCG/ACT INHALER       CELECOXIB (CELEBREX) 200 MG CAPSULE    Take 200 mg by mouth 2 (two) times daily.   CELECOXIB (CELEBREX) 200 MG CAPSULE    TAKE 1 CAPSULE BY MOUTH EVERY DAY   CELECOXIB (CELEBREX) 200 MG CAPSULE       DEXLANSOPRAZOLE (DEXILANT) 60 MG CAPSULE    TAKE 1 CAPSULE BY MOUTH EVERY DAY   DEXLANSOPRAZOLE (DEXILANT) 60 MG CAPSULE    TAKE 1 CAPSULE BY MOUTH EVERY DAY   DEXLANSOPRAZOLE (DEXILANT) 60 MG CAPSULE       DIAZEPAM (VALIUM) 10 MG TABLET    Take 10 mg by mouth every 6 (six) hours as needed for anxiety.   DIAZEPAM (VALIUM) 10 MG TABLET    Take 10 mg by mouth.   DIAZEPAM (VALIUM) 10 MG TABLET    Take by mouth.   DOXYCYCLINE (DORYX) 100 MG DR CAPSULE    Take 100 mg by mouth 2 (two) times daily.   DOXYCYCLINE (VIBRAMYCIN) 100 MG CAPSULE       DOXYCYCLINE (VIBRAMYCIN) 100 MG CAPSULE    Take 100 mg by mouth.   DOXYCYCLINE (VIBRAMYCIN) 100 MG CAPSULE    Take by mouth.   ESOMEPRAZOLE (NEXIUM) 40 MG CAPSULE    Take 40 mg by mouth daily at 12 noon.   ESOMEPRAZOLE (NEXIUM) 40 MG CAPSULE    TAKE 1 CAPSULE BY MOUTH TWICE DAILY   ESOMEPRAZOLE (NEXIUM) 40 MG CAPSULE       FLUTICASONE (FLONASE) 50 MCG/ACT NASAL SPRAY       FLUTICASONE (FLONASE) 50 MCG/ACT NASAL SPRAY    INHALE 1 SPRAY  IEN DAILY   HYDROCODONE-ACETAMINOPHEN (NORCO) 10-325 MG PER TABLET    Take 1 tablet by mouth every 6 (six) hours as needed.   HYDROCODONE-ACETAMINOPHEN (NORCO) 10-325 MG TABLET    Take 1 tablet by mouth.   HYDROCODONE-ACETAMINOPHEN (NORCO) 10-325 MG TABLET    Take by mouth.   HYDROXYZINE (ATARAX/VISTARIL) 25 MG TABLET    Take 25 mg by mouth 3 (three) times daily as needed.   HYDROXYZINE (ATARAX/VISTARIL) 25 MG TABLET    TAKE 1 TABLET BY MOUTH AS NEEDED FOR ITCHING   HYDROXYZINE (ATARAX/VISTARIL) 25 MG TABLET       KETOROLAC (ACULAR) 0.4 % SOLN       KETOROLAC  (ACULAR) 0.4 % SOLN       KETOROLAC (ACULAR) 0.4 % SOLN       LEVALBUTEROL (XOPENEX HFA) 45 MCG/ACT INHALER    Inhale into the lungs every 4 (four) hours as needed for wheezing.   LIDOCAINE (LIDODERM) 5 %    Place 1 patch onto the skin daily. Remove & Discard patch within 12 hours or as directed by MD   LIDOCAINE (LIDODERM) 5 %    Place onto the skin.   MOMETASONE (NASONEX) 50 MCG/ACT NASAL SPRAY    Place 2 sprays into the nose daily.   MONTELUKAST (SINGULAIR) 10 MG TABLET    TAKE 1 TABLET BY MOUTH EVERY NIGHT AT BEDTIME   MONTELUKAST (SINGULAIR) 10 MG  TABLET    TAKE 1 TABLET BY MOUTH EVERY NIGHT AT BEDTIME   MONTELUKAST (SINGULAIR) 10 MG TABLET       MORPHINE (MS CONTIN) 60 MG 12 HR TABLET    Take 2 tablets in the am, one at noon, and 2 in the pm   MORPHINE (MS CONTIN) 60 MG 12 HR TABLET    Take 2 tablets in the am, one at noon, and 2 in the pm   MORPHINE (MS CONTIN) 60 MG 12 HR TABLET       OFLOXACIN (OCUFLOX) 0.3 % OPHTHALMIC SOLUTION       OFLOXACIN (OCUFLOX) 0.3 % OPHTHALMIC SOLUTION       OFLOXACIN (OCUFLOX) 0.3 % OPHTHALMIC SOLUTION       PREDNISOLONE ACETATE (PRED FORTE) 1 % OPHTHALMIC SUSPENSION       PREDNISOLONE ACETATE (PRED FORTE) 1 % OPHTHALMIC SUSPENSION       PREDNISOLONE ACETATE (PRED FORTE) 1 % OPHTHALMIC SUSPENSION       RANITIDINE (ZANTAC) 300 MG TABLET    Take 300 mg by mouth at bedtime.   RANITIDINE (ZANTAC) 300 MG TABLET    TAKE 1 TABLET BY MOUTH EVERY NIGHT AT BEDTIME   RANITIDINE (ZANTAC) 300 MG TABLET       RIZATRIPTAN (MAXALT) 10 MG TABLET    Take 10 mg by mouth as needed for migraine. May repeat in 2 hours if needed   RIZATRIPTAN (MAXALT) 10 MG TABLET       SIMVASTATIN (ZOCOR) 20 MG TABLET    Take 20 mg by mouth daily.   SIMVASTATIN (ZOCOR) 20 MG TABLET    TAKE 1 TABLET BY MOUTH EVERY EVENING   SIMVASTATIN (ZOCOR) 20 MG TABLET       SUMATRIPTAN (IMITREX) 5 MG/ACT NASAL SPRAY    Place 1 spray into the nose every 2 (two) hours as needed for migraine.   SUMATRIPTAN  6 MG/0.5ML SOAJ    INJECT UNDER THE SKIN UTD   TIOTROPIUM BROMIDE MONOHYDRATE (SPIRIVA RESPIMAT) 2.5 MCG/ACT AERS    INHALE 1 PUFF BY MOUTH ONCE A DAY   TIOTROPIUM BROMIDE MONOHYDRATE (SPIRIVA RESPIMAT) 2.5 MCG/ACT AERS    INHALE 1 PUFF BY MOUTH EVERY DAY   TIOTROPIUM BROMIDE MONOHYDRATE (SPIRIVA RESPIMAT) 2.5 MCG/ACT AERS       TRIAMCINOLONE CREAM (KENALOG) 0.5 %    Apply 1 application topically 3 (three) times daily.   VALSARTAN (DIOVAN) 160 MG TABLET    Take 160 mg by mouth daily.   VALSARTAN (DIOVAN) 160 MG TABLET    TAKE 1 TABLET BY MOUTH EVERY DAY   VALSARTAN (DIOVAN) 160 MG TABLET       Modified Medications   No medications on file   New Prescriptions   No medications on file   Discontinued Medications   No medications on file   Orders Placed This Encounter  Procedures  . MR Shoulder Right Wo Contrast   ASSESSMENT & PLAN: Right shoulder pain: This is recurrent. She has had 2 subacromial injections as well as an before meals joint injection which all have provided several weeks of relief. She has been religious with her rotator cuff strengthening exercises. This is been ongoing for at least 6 months. A partial supraspinatus tear was identified on ultrasound in the past. We will obtain an MRI to further evaluate the area and guide treatment. We will see her back after the MRI to rediscuss this.  Right forefoot pain/bunion: She has mild Morton's changes. She has a relatively  small bunion that has aggravated her for a long time. We performed an injection today although she may do best with wider toe box shoes. She does not have any degenerative change of the first MTP on exam or ultrasound. We also provided her with a first ray post. Follow-up as needed.  She will follow-up next week for her other musculoskeletal issues.

## 2015-09-15 ENCOUNTER — Ambulatory Visit
Admission: RE | Admit: 2015-09-15 | Discharge: 2015-09-15 | Disposition: A | Discharge status: Home / Self Care | Payer: Medicare Other | Source: Ambulatory Visit | Attending: Family Medicine | Admitting: Family Medicine

## 2015-09-15 DIAGNOSIS — M25511 Pain in right shoulder: Secondary | ICD-10-CM

## 2015-09-16 ENCOUNTER — Ambulatory Visit: Payer: Medicare Other | Admitting: Family Medicine

## 2015-09-23 ENCOUNTER — Ambulatory Visit (INDEPENDENT_AMBULATORY_CARE_PROVIDER_SITE_OTHER): Payer: Medicare Other | Admitting: Family Medicine

## 2015-09-23 ENCOUNTER — Encounter: Payer: Self-pay | Admitting: Family Medicine

## 2015-09-23 VITALS — BP 166/86 | HR 70 | Ht 63.0 in | Wt 245.0 lb

## 2015-09-23 DIAGNOSIS — M7521 Bicipital tendinitis, right shoulder: Secondary | ICD-10-CM | POA: Diagnosis not present

## 2015-09-23 DIAGNOSIS — S43431D Superior glenoid labrum lesion of right shoulder, subsequent encounter: Secondary | ICD-10-CM | POA: Diagnosis not present

## 2015-09-23 DIAGNOSIS — M25511 Pain in right shoulder: Secondary | ICD-10-CM | POA: Diagnosis present

## 2015-09-23 DIAGNOSIS — IMO0001 Reserved for inherently not codable concepts without codable children: Secondary | ICD-10-CM

## 2015-09-23 DIAGNOSIS — S46811D Strain of other muscles, fascia and tendons at shoulder and upper arm level, right arm, subsequent encounter: Secondary | ICD-10-CM | POA: Diagnosis not present

## 2015-09-24 NOTE — Progress Notes (Signed)
Caitlin Moore - 55 y.o. female MRN [id number]  Date of birth: 1961-01-23  CC: Right shoulder and right forefoot pain.   SUBJECTIVE:   HPI Right shoulder pain, recurrent: - She was previously seen on 06/17/2015 and performed her second subacromial injection within a 3 month period. She has also had an  AC injection. - She had good relief of her pain following her last injection until a few weeks ago. She believes injections helped her for about 2 months.  - Pain with most overhead movement and reaching around back. . - On numerous chronic pain medications. No antiinflammatories.  - No radiation of pain from her neck. No weakness or tingling.  - Has been doing RC exercises 'religiously'.  - No new trauma - Recently her pain has been radiating to her distal right bicep on occasion.  We saw her last month and ordered an MRI consdiering her recalcitrant discomfort despite multiple injections and adequate therapy.   ROS:     10 point review of systems negative other than that listed in history of present illness.  HISTORY: Past Medical, Surgical, Social, and Family History Reviewed & Updated per EMR.  COPD, chronic pain   MRI Reviewed of right shoulder w/o contrast: Severe tendinosis and small bursal sided & small interstitial tear of the anterior fibers, severe tendinosis of the infra tendon, mild tendinosis of the long head of the biceps, fluid collection within neck extending from the inferior labrum most concerning for a paralabral cyst and likely with a underlying inferior labral tear.  OBJECTIVE: BP 166/86 mmHg  Pulse 70  Ht 5\' 3"  (1.6 m)  Wt 245 lb (111.131 kg)  BMI 43.41 kg/m2  Physical Exam   Alert and oriented 3, no acute distress, smiling Nonlabored breathing, speaking in complete sentences without distress.     MEDICATIONS, LABS & OTHER ORDERS: Previous Medications   AMPHETAMINE-DEXTROAMPHETAMINE (ADDERALL) 10 MG TABLET    Take 10 mg by mouth.   AMPHETAMINE-DEXTROAMPHETAMINE (ADDERALL) 15 MG TABLET    TK 1 T PO BID   AZITHROMYCIN (ZITHROMAX) 250 MG TABLET       BUDESONIDE-FORMOTEROL (SYMBICORT) 160-4.5 MCG/ACT INHALER    INHALE 2 PUFFS BY MOUTH TWICE DAILY   CELECOXIB (CELEBREX) 200 MG CAPSULE    Take 200 mg by mouth 2 (two) times daily.   DEXLANSOPRAZOLE (DEXILANT) 60 MG CAPSULE    TAKE 1 CAPSULE BY MOUTH EVERY DAY   DIAZEPAM (VALIUM) 10 MG TABLET    Take 10 mg by mouth every 6 (six) hours as needed for anxiety.   DOXYCYCLINE (DORYX) 100 MG DR CAPSULE    Take 100 mg by mouth 2 (two) times daily.   DOXYCYCLINE (VIBRAMYCIN) 100 MG CAPSULE    Take 100 mg by mouth.   ESOMEPRAZOLE (NEXIUM) 40 MG CAPSULE    Take 40 mg by mouth daily at 12 noon.   FLUTICASONE (FLONASE) 50 MCG/ACT NASAL SPRAY    INHALE 1 SPRAY  IEN DAILY   HYDROCHLOROTHIAZIDE (HYDRODIURIL) 50 MG TABLET    Take by mouth.   HYDROCODONE-ACETAMINOPHEN (NORCO) 10-325 MG PER TABLET    Take 1 tablet by mouth every 6 (six) hours as needed.   HYDROXYZINE (ATARAX/VISTARIL) 25 MG TABLET    TAKE 1 TABLET BY MOUTH AS NEEDED FOR ITCHING   KETOROLAC (ACULAR) 0.4 % SOLN       LEVALBUTEROL (XOPENEX HFA) 45 MCG/ACT INHALER    Inhale into the lungs every 4 (four) hours as needed for wheezing.   LIDOCAINE (  LIDODERM) 5 %    Place 1 patch onto the skin daily. Remove & Discard patch within 12 hours or as directed by MD   MOMETASONE (NASONEX) 50 MCG/ACT NASAL SPRAY    Place 2 sprays into the nose daily.   MONTELUKAST (SINGULAIR) 10 MG TABLET    TAKE 1 TABLET BY MOUTH EVERY NIGHT AT BEDTIME   MORPHINE (MS CONTIN) 60 MG 12 HR TABLET    Take 2 tablets in the am, one at noon, and 2 in the pm   OFLOXACIN (OCUFLOX) 0.3 % OPHTHALMIC SOLUTION       PREDNISOLONE ACETATE (PRED FORTE) 1 % OPHTHALMIC SUSPENSION       RANITIDINE (ZANTAC) 300 MG TABLET    Take 300 mg by mouth at bedtime.   RIZATRIPTAN (MAXALT) 10 MG TABLET    Take 10 mg by mouth as needed for migraine. May repeat in 2 hours if needed    SIMVASTATIN (ZOCOR) 20 MG TABLET    TAKE 1 TABLET BY MOUTH EVERY EVENING   SUMATRIPTAN (IMITREX) 5 MG/ACT NASAL SPRAY    Place 1 spray into the nose every 2 (two) hours as needed for migraine.   TIOTROPIUM BROMIDE MONOHYDRATE (SPIRIVA RESPIMAT) 2.5 MCG/ACT AERS    INHALE 1 PUFF BY MOUTH ONCE A DAY   TRIAMCINOLONE CREAM (KENALOG) 0.5 %    Apply 1 application topically 3 (three) times daily.   VALSARTAN (DIOVAN) 160 MG TABLET    Take 160 mg by mouth daily.   Modified Medications   No medications on file   New Prescriptions   No medications on file   Discontinued Medications   AMPHETAMINE-DEXTROAMPHETAMINE (ADDERALL) 15 MG TABLET    Take by mouth.   BUDESONIDE-FORMOTEROL (SYMBICORT) 160-4.5 MCG/ACT INHALER       CELECOXIB (CELEBREX) 200 MG CAPSULE    TAKE 1 CAPSULE BY MOUTH EVERY DAY   CELECOXIB (CELEBREX) 200 MG CAPSULE       DEXLANSOPRAZOLE (DEXILANT) 60 MG CAPSULE    TAKE 1 CAPSULE BY MOUTH EVERY DAY   DEXLANSOPRAZOLE (DEXILANT) 60 MG CAPSULE       DIAZEPAM (VALIUM) 10 MG TABLET    Take 10 mg by mouth.   DIAZEPAM (VALIUM) 10 MG TABLET    Take by mouth.   DOXYCYCLINE (VIBRAMYCIN) 100 MG CAPSULE       DOXYCYCLINE (VIBRAMYCIN) 100 MG CAPSULE    Take by mouth.   ESOMEPRAZOLE (NEXIUM) 40 MG CAPSULE    TAKE 1 CAPSULE BY MOUTH TWICE DAILY   ESOMEPRAZOLE (NEXIUM) 40 MG CAPSULE       FLUTICASONE (FLONASE) 50 MCG/ACT NASAL SPRAY       HYDROCODONE-ACETAMINOPHEN (NORCO) 10-325 MG TABLET    Take 1 tablet by mouth.   HYDROCODONE-ACETAMINOPHEN (NORCO) 10-325 MG TABLET    Take by mouth.   HYDROXYZINE (ATARAX/VISTARIL) 25 MG TABLET    Take 25 mg by mouth 3 (three) times daily as needed.   HYDROXYZINE (ATARAX/VISTARIL) 25 MG TABLET       KETOROLAC (ACULAR) 0.4 % SOLN       KETOROLAC (ACULAR) 0.4 % SOLN       LIDOCAINE (LIDODERM) 5 %    Place onto the skin.   MONTELUKAST (SINGULAIR) 10 MG TABLET    TAKE 1 TABLET BY MOUTH EVERY NIGHT AT BEDTIME   MONTELUKAST (SINGULAIR) 10 MG TABLET       MORPHINE (MS  CONTIN) 60 MG 12 HR TABLET    Take 2 tablets in the am, one at noon,  and 2 in the pm   MORPHINE (MS CONTIN) 60 MG 12 HR TABLET       OFLOXACIN (OCUFLOX) 0.3 % OPHTHALMIC SOLUTION       OFLOXACIN (OCUFLOX) 0.3 % OPHTHALMIC SOLUTION       PREDNISOLONE ACETATE (PRED FORTE) 1 % OPHTHALMIC SUSPENSION       PREDNISOLONE ACETATE (PRED FORTE) 1 % OPHTHALMIC SUSPENSION       RANITIDINE (ZANTAC) 300 MG TABLET    TAKE 1 TABLET BY MOUTH EVERY NIGHT AT BEDTIME   RANITIDINE (ZANTAC) 300 MG TABLET       RIZATRIPTAN (MAXALT) 10 MG TABLET       SIMVASTATIN (ZOCOR) 20 MG TABLET    Take 20 mg by mouth daily.   SIMVASTATIN (ZOCOR) 20 MG TABLET       SUMATRIPTAN 6 MG/0.5ML SOAJ    INJECT UNDER THE SKIN UTD   TIOTROPIUM BROMIDE MONOHYDRATE (SPIRIVA RESPIMAT) 2.5 MCG/ACT AERS    INHALE 1 PUFF BY MOUTH EVERY DAY   TIOTROPIUM BROMIDE MONOHYDRATE (SPIRIVA RESPIMAT) 2.5 MCG/ACT AERS       VALSARTAN (DIOVAN) 160 MG TABLET    TAKE 1 TABLET BY MOUTH EVERY DAY   VALSARTAN (DIOVAN) 160 MG TABLET       No orders of the defined types were placed in this encounter.   ASSESSMENT & PLAN: Right shoulder pain: This is recurrent. She has had 2 subacromial injections as well as an AC joint injection which all have provided several weeks of relief. MRI has shown severe supra and infra tendinosis as well as mild biceps tendinosis, and a likely labral tear with a 10mm paralabral cyst. She has been religious with her rotator cuff strengthening exercises. This is been ongoing for at least 6 months. At this point discussing surgery is the next step. She is to continue with passive RoM, do minimal RC strengthening (only if pain free), and work on scapular stabilizers.  She wishes to have 1 more injection in may and will return then.  She then plans to have surgery in Fall of 2017 if need be.  Call with questions.  Always a pleasure to see Ms. Aiello.

## 2015-11-25 ENCOUNTER — Ambulatory Visit (INDEPENDENT_AMBULATORY_CARE_PROVIDER_SITE_OTHER): Payer: Medicare Other | Admitting: Family Medicine

## 2015-11-25 ENCOUNTER — Encounter: Payer: Self-pay | Admitting: Family Medicine

## 2015-11-25 VITALS — BP 118/53 | HR 75 | Ht 63.0 in | Wt 240.0 lb

## 2015-11-25 DIAGNOSIS — M25511 Pain in right shoulder: Secondary | ICD-10-CM

## 2015-11-25 MED ORDER — METHYLPREDNISOLONE ACETATE 40 MG/ML IJ SUSP
40.0000 mg | Freq: Once | INTRAMUSCULAR | Status: AC
  Administered 2015-11-25: 40 mg via INTRA_ARTICULAR

## 2015-11-25 NOTE — Progress Notes (Signed)
Patient ID: Caitlin Moore, female   DOB: July 21, 1960, 55 y.o.   MRN: [id number] [facility] Attending Note: I have seen and examined this patient. I have discussed this patient with the resident and reviewed the assessment and plan as documented above. I agree with the resident's findings and plan. CSI today  at some point she wishes to see about shoulder surgery but financially right ow that is not possible.

## 2015-11-25 NOTE — Progress Notes (Signed)
Subjective: CC: f/u right shoulder pain HPI: Patient is a 55 y.o. female with a past medical history of severe supra and infra tendinosis as well as mild biceps tendinosis, and a likely labral tear with a 10mm paralabral cyst presenting to clinic today for a f/u on R shoulder pain and requesting an injection.  She notes pain is the most prominent in the morning and most notable with lifting her arm overhead. She had numbness and tingling in her hands bilaterally but no radiation from shoulder down the arm. She's doing MS Contin, Celebrex and Norco.  She continues to do rotator cuff exercises.   She's undergone 3 injections, the last on 09/09/15. She's had both subacromial and AC joint injections and found them both helpful. In general the injections last approximately 2 months.     ROS: All other systems reviewed and are negative besides that noted in HPI  Past Medical History Patient Active Problem List   Diagnosis Date Noted  . Strain of iliopsoas muscle 05/14/2015  . Trochanteric bursitis of right hip 04/09/2015  . Right shoulder pain 04/03/2015  . Pes planus of both feet 02/27/2015  . Posterior tibial tendinitis of left leg 02/27/2015  . Lumbar radiculopathy, chronic 02/27/2015  . Chronic pain disorder   . Carpal tunnel syndrome   . Anxiety   . HA (headache)   . COPD (chronic obstructive pulmonary disease) (HCC)   . ABDOMINAL PAIN, EPIGASTRIC 03/25/2010    Medications- reviewed and updated Current Outpatient Prescriptions  Medication Sig Dispense Refill  . amphetamine-dextroamphetamine (ADDERALL) 10 MG tablet Take 10 mg by mouth.    Marland Kitchen amphetamine-dextroamphetamine (ADDERALL) 15 MG tablet TK 1 T PO BID  0  . azithromycin (ZITHROMAX) 250 MG tablet   0  . budesonide-formoterol (SYMBICORT) 160-4.5 MCG/ACT inhaler INHALE 2 PUFFS BY MOUTH TWICE DAILY    . celecoxib (CELEBREX) 200 MG capsule Take 200 mg by mouth 2 (two) times daily.    Marland Kitchen dexlansoprazole (DEXILANT) 60 MG capsule  TAKE 1 CAPSULE BY MOUTH EVERY DAY    . diazepam (VALIUM) 10 MG tablet Take 10 mg by mouth every 6 (six) hours as needed for anxiety.    Marland Kitchen doxycycline (DORYX) 100 MG DR capsule Take 100 mg by mouth 2 (two) times daily.    Marland Kitchen doxycycline (VIBRAMYCIN) 100 MG capsule Take 100 mg by mouth.    . esomeprazole (NEXIUM) 40 MG capsule Take 40 mg by mouth daily at 12 noon.    . fluticasone (FLONASE) 50 MCG/ACT nasal spray INHALE 1 SPRAY  IEN DAILY  12  . hydrochlorothiazide (HYDRODIURIL) 50 MG tablet Take by mouth.    Marland Kitchen HYDROcodone-acetaminophen (NORCO) 10-325 MG per tablet Take 1 tablet by mouth every 6 (six) hours as needed.    . hydrOXYzine (ATARAX/VISTARIL) 25 MG tablet TAKE 1 TABLET BY MOUTH AS NEEDED FOR ITCHING    . ketorolac (ACULAR) 0.4 % SOLN     . levalbuterol (XOPENEX HFA) 45 MCG/ACT inhaler Inhale into the lungs every 4 (four) hours as needed for wheezing.    . lidocaine (LIDODERM) 5 % Place 1 patch onto the skin daily. Remove & Discard patch within 12 hours or as directed by MD    . mometasone (NASONEX) 50 MCG/ACT nasal spray Place 2 sprays into the nose daily.    . montelukast (SINGULAIR) 10 MG tablet TAKE 1 TABLET BY MOUTH EVERY NIGHT AT BEDTIME    . morphine (MS CONTIN) 60 MG 12 hr tablet Take 2 tablets  in the am, one at noon, and 2 in the pm    . ofloxacin (OCUFLOX) 0.3 % ophthalmic solution     . prednisoLONE acetate (PRED FORTE) 1 % ophthalmic suspension     . ranitidine (ZANTAC) 300 MG tablet Take 300 mg by mouth at bedtime.    . rizatriptan (MAXALT) 10 MG tablet Take 10 mg by mouth as needed for migraine. May repeat in 2 hours if needed    . simvastatin (ZOCOR) 20 MG tablet TAKE 1 TABLET BY MOUTH EVERY EVENING    . SUMAtriptan (IMITREX) 5 MG/ACT nasal spray Place 1 spray into the nose every 2 (two) hours as needed for migraine.    . Tiotropium Bromide Monohydrate (SPIRIVA RESPIMAT) 2.5 MCG/ACT AERS INHALE 1 PUFF BY MOUTH ONCE A DAY    . triamcinolone cream (KENALOG) 0.5 % Apply 1  application topically 3 (three) times daily.    . valsartan (DIOVAN) 160 MG tablet Take 160 mg by mouth daily.     No current facility-administered medications for this visit.    Objective: Office vital signs reviewed. BP 118/53 mmHg  Pulse 75  Ht 5\' 3"  (1.6 m)  Wt 240 lb (108.863 kg)  BMI 42.52 kg/m2   Physical Examination:  General: Awake, alert, well-nourished, NAD  Right shoulder: No atrophy, swelling, asymmetry noted. Tenderness over the [facility] joint, anterior joint, and subacromial space. Decreased abduction to around 140* and decreased internal rotation to approximately T1. Normal strength. + empty can test. Negative Hawkins. Negative Obrien's test. Neurovascularly intact distally.   Assessment/Plan: Right shoulder pain Injection performed today. Continue exercises. She will ultimately require surgical intervention. Patient to follow up with us in approximately 8 weeks or sooner as needed.  Procedure: right shoulder steroid injection Consent signed and scanned into record. Medication:  1 cc Solumedrol  4 cc Lidocaine 1% without epi Preparation: area cleansed with betadine Time Out taken  Injection  Landmarks identified 5 cc of medication injected into subacromial joint.  Patient tolerated well without bleeding or paresthesias  Patient had good range of motion of joint after injection     No orders of the defined types were placed in this encounter.    Meds ordered this encounter  Medications  . methylPREDNISolone acetate (DEPO-MEDROL) injection 40 mg    Sig:     Joanna Puffrystal S. Dorsey PGY-2, [facility]

## 2015-11-25 NOTE — Assessment & Plan Note (Signed)
Injection performed today. Continue exercises. She will ultimately require surgical intervention. Patient to follow up with us in approximately 8 weeks or sooner as needed.  Procedure: right shoulder steroid injection Consent signed and scanned into record. Medication:  1 cc Solumedrol  4 cc Lidocaine 1% without epi Preparation: area cleansed with betadine Time Out taken  Injection  Landmarks identified 5 cc of medication injected into subacromial joint.  Patient tolerated well without bleeding or paresthesias  Patient had good range of motion of joint after injection

## 2015-12-12 ENCOUNTER — Ambulatory Visit (INDEPENDENT_AMBULATORY_CARE_PROVIDER_SITE_OTHER): Payer: Medicare Other | Admitting: Family Medicine

## 2015-12-12 ENCOUNTER — Encounter: Payer: Self-pay | Admitting: Family Medicine

## 2015-12-12 VITALS — BP 130/68 | HR 72 | Ht 63.0 in | Wt 240.0 lb

## 2015-12-12 DIAGNOSIS — M25561 Pain in right knee: Secondary | ICD-10-CM | POA: Insufficient documentation

## 2015-12-12 MED ORDER — METHYLPREDNISOLONE ACETATE 40 MG/ML IJ SUSP
40.0000 mg | Freq: Once | INTRAMUSCULAR | Status: AC
  Administered 2015-12-12: 40 mg via INTRA_ARTICULAR

## 2015-12-12 NOTE — Assessment & Plan Note (Addendum)
Probable underlying knee OA as well as possible meniscus tear in acute setting.  She also has superimposed Pes anserine bursitis which is most likely 2/2 knee pathology/alignment.  Differential includes tibial plateau bone bruise vs fx vs chondral injury, aggravation of underlying OA, meniscus tear/ACL tear, synovitis.   - Discussed options today including injection and of the knee versus diagnostic imaging including x-rays and MRI to delineate further treatment and underlying pathology. At this point she'll eventually injection in 3-4 weeks of conservative management. This is not improving at her next visit I would probably consider further imaging.   Aspiration/Injection Procedure Note Caitlin Moore 16-Nov-1960  Procedure: Injection Indications: R knee pain   Procedure Details Consent: Risks of procedure as well as the alternatives and risks of each were explained to the (patient/caregiver).  Consent for procedure obtained. Time Out: Verified patient identification, verified procedure, site/side was marked, verified correct patient position, special equipment/implants available, medications/allergies/relevent history reviewed, required imaging and test results available.  Performed.  The area was cleaned with iodine and alcohol swabs.    The R knee joint was injected using 2 cc's of 40mg  Depomedrol and 4 cc's of 1% lidocaine with a 21 1 1/2" needle.    A sterile dressing was applied.  Patient did tolerate procedure well. Estimated blood loss: None

## 2015-12-12 NOTE — Progress Notes (Signed)
  Caitlin Moore - 55 y.o. female MRN [id number]  Date of birth: 04/19/1961  SUBJECTIVE:  Including CC & ROS.  Caitlin Moore is a 55 y.o. female who presents today for R knee pain.    Knee Pain R initial visit 12/12/15 - patient presents today with ongoing right medial knee pain for the past 4-5 weeks. This began after she had a hyperextension/twisting injury of the right knee when her sandal got caught in the floor chasing her dog. She was unable to walk at that time and thinks that she had some swelling. Pain continues to bother her substantially especially at night. She does deny instability but may have catching. She has tried a knee brace which has not helped too much and also medication which has not helped too much either. No previous injury to the left knee or right knee.  PMHx - Updated and reviewed.  Contributory factors include: COPD PSHx - Updated and reviewed.  Contributory factors include:  Lumbar surgery  FHx - Updated and reviewed.  Contributory factors include:  Lung Ca maternal  Social Hx - Updated and reviewed. Contributory factors include: Non smoker, former   Medications - Reviewed    12 point ROS negative other than per HPI.   Exam:  Filed Vitals:   12/12/15 1110  BP: 130/68  Pulse: 72   Gen: NAD, AAO 3 Cardio- RRR Pulm - Normal respiratory effort/rate Skin: No rashes or erythema Extremities: No edema  Vascular: pulses +2 bilateral upper and lower extremity Psych: Normal affect   R Knee:  Normal to inspection with no erythema or effusion or obvious bony abnormalities.  No obvious Baker's cysts Slight medial joint line TTP.  TTP along  pes anserine bursas.   ROM normal in flexion (135 degrees) and extension (0 degrees) and lower leg rotation. Ligaments with solid consistent endpoints including ACL, PCL, LCL, MCL.  Negative Lachman Equvical Mcmurray's and provocative meniscal tests including Thessaly and Apley compression testing  Patellar and quadriceps  tendons unremarkable. Hamstring and quadriceps strength is normal.  Neurovascularly intact B/L LE

## 2015-12-16 NOTE — Progress Notes (Signed)
Patient ID: Caitlin Moore, female   DOB: Apr 14, 1961, 55 y.o.   MRN: [id number] [facility]: Attending Note: I have reviewed the chart, discussed wit the Sports Medicine Fellow. I agree with assessment and treatment plan as detailed in the Fellow's note. I think this is most likely a meniscal issue. I gave her options including MRI, CSI, conservative tx and f/u. She chose  CSI and will follow up if not seeing improvement in 3 weeks.

## 2016-01-09 ENCOUNTER — Ambulatory Visit: Payer: Medicare Other | Admitting: Family Medicine

## 2016-03-26 ENCOUNTER — Encounter: Payer: Self-pay | Admitting: Family Medicine

## 2016-03-26 ENCOUNTER — Ambulatory Visit (INDEPENDENT_AMBULATORY_CARE_PROVIDER_SITE_OTHER): Payer: Medicare Other | Admitting: Family Medicine

## 2016-03-26 DIAGNOSIS — M533 Sacrococcygeal disorders, not elsewhere classified: Secondary | ICD-10-CM

## 2016-03-26 DIAGNOSIS — M722 Plantar fascial fibromatosis: Secondary | ICD-10-CM | POA: Diagnosis present

## 2016-03-26 MED ORDER — METHYLPREDNISOLONE ACETATE 40 MG/ML IJ SUSP
40.0000 mg | Freq: Once | INTRAMUSCULAR | Status: AC
  Administered 2016-03-26: 40 mg via INTRA_ARTICULAR

## 2016-03-26 NOTE — Assessment & Plan Note (Signed)
She has appropriate insoles with scaphoid pads which I recommend she continue to use. She has supportive foot wear. Today we did a corticosteroid injection above the area of the plantar fascial origin. She'll continue to do the stretches and exercises. I'll see her back in 4 weeks. If she's not improving would consider

## 2016-03-26 NOTE — Progress Notes (Signed)
    CHIEF COMPLAINT / HPI: #1. Left plantar foot pain #2. Right sacroiliac joint pain  Over the last 2-3 weeks she's had acute onset of pain in the plantar portion of her foot located right over the heel. Significantly painful with first steps in the morning or she's been sitting for a while and then stands up. Pain is like someone "drove a nail into my heel". He gets a little bit better with walking but never totally goes away. She's had no specific injury. She has some similar symptoms years ago on the right side was diagnosed of plantar fasciitis. At that time they gave her some insoles which she's been wearing until the last week when she said band saw actually seems to make everything worse.  Right posterior buttock area has been aching with standing and sitting. She thinks it's because she's having to walk so funny because her left foot is hurting. Pain is 4 out of 10. Does not radiate down the leg. No leg weakness, no incontinence of bowel or bladder. She's never had this, pain before. No specific injury or prior surgery to the hip or buttock area. She does have some history of lumbar radiculopathy but this is type pain is very different from that.  REVIEW OF SYSTEMS:  No unusual weight change, fever, sweats, chills. See history of present illness above for additional pertinent review of systems  OBJECTIVE:  Vital signs are reviewed.   GEN.: Well-developed obese female no acute distress HIPS: Internal/external rotation is full and painless. She's tender to palpation over the right SI joint area. Pearlean BrownieFaber testing is positive on the right. Normal Trendelenburg testing. Hip flexor and extensor strength 5 out of 5 symmetrical. FEET: Bilateral pes planus. She's tender to palpation at the origin of plantar fracture on the left and this reproduces her pain. ANKLE: Left. Inversion eversion range of motion strength is normal. Dorsiflexion plantar flexion range of motion strength is normal. SKIN: Skin  of the foot and the right buttock area have no rash, no erythema, no increased warmth, no lesions.  INJECTION: Patient was given informed consent, signed copy in the chart. Appropriate time out was taken. Area prepped and draped in usual sterile fashion. 1 cc of methylprednisolone 40 mg/ml plus  1 cc of 1% lidocaine without epinephrine was injected into the area above the left plantar fascia using a(n) medial approach. The patient tolerated the procedure well. There were no complications. Post procedure instructions were given.   ASSESSMENT / PLAN: Sacroiliac pain likely from side effect of having left plantar fascial pain and altering her gait. I'll give her some general hip rehabilitation exercises to do 3 times a day and see her back in 4 weeks. If the problems are not resolving we could consider sacroiliac joint injection.

## 2016-03-26 NOTE — Patient Instructions (Signed)
For your left plantar fasciitis I am giving you an injection today. I would ice it at least 3 times a day for 10-15 minutes each time. Also do the rolling /stretching exercises using a frozen soda bottle as well as her other foot exercises.  For your hip, I would like you to do the hip exercises I have given you in handout form. If the hip still bothering you in 3 or 4 weeks, come back into the clinic we'll consider an injection.   I hope this helps the foot resolve.

## 2016-05-19 ENCOUNTER — Ambulatory Visit
Admission: RE | Admit: 2016-05-19 | Discharge: 2016-05-19 | Disposition: A | Discharge status: Home / Self Care | Payer: Medicare Other | Source: Ambulatory Visit | Attending: Family Medicine | Admitting: Family Medicine

## 2016-05-19 ENCOUNTER — Other Ambulatory Visit: Payer: Self-pay | Admitting: Family Medicine

## 2016-05-19 DIAGNOSIS — R05 Cough: Secondary | ICD-10-CM

## 2016-05-19 DIAGNOSIS — J441 Chronic obstructive pulmonary disease with (acute) exacerbation: Secondary | ICD-10-CM

## 2016-05-19 DIAGNOSIS — R059 Cough, unspecified: Secondary | ICD-10-CM

## 2016-12-01 ENCOUNTER — Encounter: Payer: Self-pay | Admitting: Student

## 2016-12-01 ENCOUNTER — Ambulatory Visit (INDEPENDENT_AMBULATORY_CARE_PROVIDER_SITE_OTHER): Payer: Medicare Other | Admitting: Student

## 2016-12-01 DIAGNOSIS — M19049 Primary osteoarthritis, unspecified hand: Secondary | ICD-10-CM | POA: Diagnosis present

## 2016-12-01 MED ORDER — METHYLPREDNISOLONE ACETATE 40 MG/ML IJ SUSP
20.0000 mg | Freq: Once | INTRAMUSCULAR | Status: AC
  Administered 2016-12-01: 20 mg via INTRA_ARTICULAR

## 2016-12-01 NOTE — Assessment & Plan Note (Signed)
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 This was injected, see procedure note Can consider repeat injections if it gets flared up again. Recommend modifying activities that aggravate it.

## 2016-12-01 NOTE — Progress Notes (Signed)
  Caitlin Moore FrederickK Coby - 56 y.o. female MRN [id number]  Date of birth: [birth year]  SUBJECTIVE:  Including CC & ROS.  WU:[id number]:right hand pain  Presents with right second metacarpal pain. She states that it has been hurting her for probably 15 years but is comparable to the point that she is having very significant pain. She feels as though there is something in between her second and third MCP joint. She tries to massage it but it does not help. She has tried bracing for but it has not helped. Denies any numbness or tingling. She does have swelling to the area.   ROS: No unexpected weight loss, fever, chills, swelling, instability, muscle pain, numbness/tingling, redness, otherwise see HPI   PMHx - Updated and reviewed.  Contributory factors include: chronic pain PSHx - Updated and reviewed.  Contributory factors include:  Negative FHx - Updated and reviewed.  Contributory factors include:  Negative Social Hx - Updated and reviewed. Contributory factors include: Negative Medications - reviewed   DATA REVIEWED: Previous office visits  PHYSICAL EXAM:  VS: BP:(!) 134/92  HR: bpm  TEMP: ( )  RESP:   HT:5\' 3"  (160 cm)   WT:249 lb (112.9 kg)  BMI:44.2 PHYSICAL EXAM: Gen: NAD, alert, cooperative with exam, well-appearing HEENT: clear conjunctiva,  CV:  no edema, capillary refill brisk, normal rate Resp: non-labored Skin: no rashes, normal turgor  Neuro: no gross deficits.  Psych:  alert and oriented  Right hand: Swelling and slight ulnar deviation at the second MCP joint. 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 This was injected, see procedure note Can consider repeat injections if it gets flared up again. Recommend modifying activities that aggravate it.  Procedure: Real-time Ultrasound Guided Injection of right second MCP joint Device: GE Logiq E  Ultrasound guided injection is preferred based studies that show increased duration, increased effect, greater accuracy, decreased procedural pain, increased response rate, and decreased cost with ultrasound guided versus blind injection.  Verbal informed consent obtained.  Time-out conducted.  Noted no overlying erythema, induration, or other signs of local infection.  Skin prepped in a sterile fashion.  Local anesthesia: Topical Ethyl chloride.  With sterile technique and under real time ultrasound guidance:  20 mg Depo-Medrol, 0.5 mL 1% lidocaine Completed without difficulty  Pain immediately resolved suggesting accurate placement of the medication.  Advised to call if fevers/chills, erythema, induration, drainage, or persistent bleeding.  Images permanently stored and available for review in the ultrasound unit.  Impression: Technically successful ultrasound guided injection.

## 2016-12-17 ENCOUNTER — Encounter: Payer: Self-pay | Admitting: Family Medicine

## 2016-12-17 ENCOUNTER — Ambulatory Visit (INDEPENDENT_AMBULATORY_CARE_PROVIDER_SITE_OTHER): Payer: Medicare Other | Admitting: Family Medicine

## 2016-12-17 DIAGNOSIS — M25561 Pain in right knee: Secondary | ICD-10-CM

## 2016-12-17 MED ORDER — METHYLPREDNISOLONE ACETATE 40 MG/ML IJ SUSP
40.0000 mg | Freq: Once | INTRAMUSCULAR | Status: AC
  Administered 2016-12-17: 40 mg via INTRA_ARTICULAR

## 2016-12-17 NOTE — Progress Notes (Signed)
    CHIEF COMPLAINT / HPI:   Right knee pain. Worsening over the last 3-4 weeks. Some increased stiffness. Maybe a little mild swelling intermittently but nothing significant. No locking. Does feel like his, give way at times. Pain is worse with any attempt to climb stairs and essentially she's unable to do that without significant amount of pain. Walking is also painful particularly for long distances.    REVIEW OF SYSTEMS:  Denies fever, chills, sweats. No unusual weight change. No other unusual arthralgias although she has chronic low back pain. No back pain is unchanged. Has noted no rash.  OBJECTIVE:  Vital signs are reviewed.   GEN.: Well-developed obese female no acute distress KNEES: Bilaterally symmetrical. Right knee reveals no effusion. She's ligamentously intact to varus and valgus stress. She's tender to palpation at the medial joint line particularly the anterior medial area. She has positive McMurray, positive Thessaly. Popliteal space is benign. The calf is soft. VASCULAR: Her cells pedis pulses 2+ bilaterally equal SKIN: There is no erythema, no unusual warmth,skin lesions or rash noted around the right knee. GAIT: Antalgic.   IMAGING: There is no imaging of the knees in the last 18 months.  INJECTION: Patient was given informed consent, signed copy in the chart. Appropriate time out was taken. Area prepped and draped in usual sterile fashion. 1 cc of methylprednisolone 40 mg/ml plus  4 cc of 1% lidocaine without epinephrine was injected into the right knee using a(n) anterior medial approach. The patient tolerated the procedure well. There were no complications. Post procedure instructions were given.   ASSESSMENT / PLAN: Please see problem oriented charting for details

## 2016-12-17 NOTE — Assessment & Plan Note (Signed)
I suspect she has meniscal pathology in this knee. I had seen her about year ago and she had corticosteroid injection which really helped. We performed another steroid injection today she will follow-up when necessary. If this does not improve, I would recommend imaging with at least plain x-rays. She's aware that weight has some part in symptom exacerbation.

## 2017-06-08 IMAGING — CR DG HIP (WITH OR WITHOUT PELVIS) 3-4V BILAT
4 series · 4 of 4 positions shown · non-contrast
Comparison: None.

CLINICAL DATA: Bilateral hip pre right greater than left

EXAM:
DG HIP (WITH OR WITHOUT PELVIS) 3-4V BILAT

[view not recorded (1 of 4)]
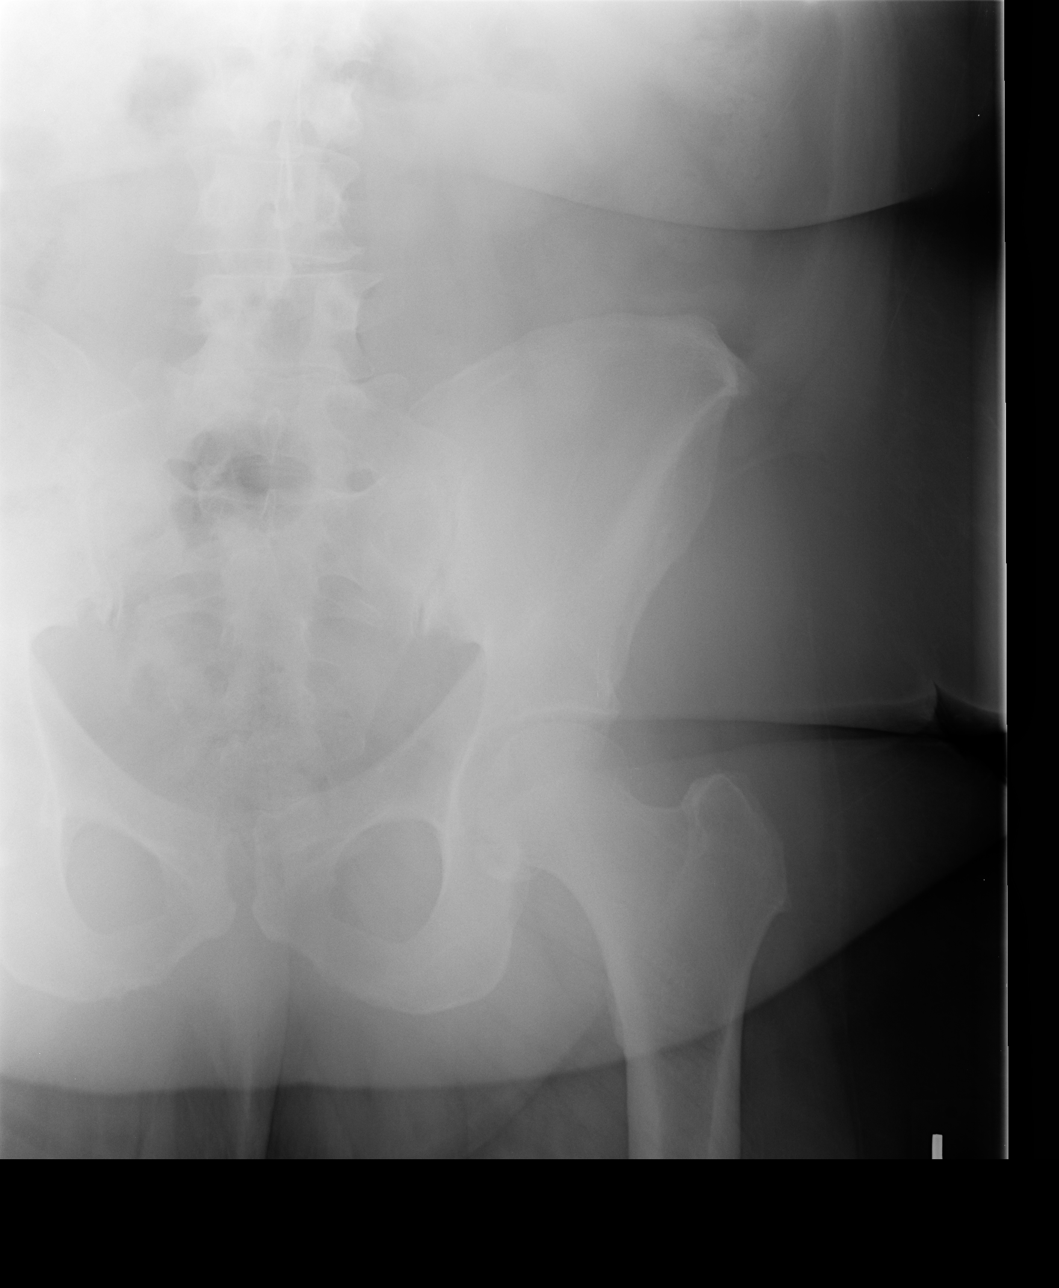

[view not recorded (2 of 4)]
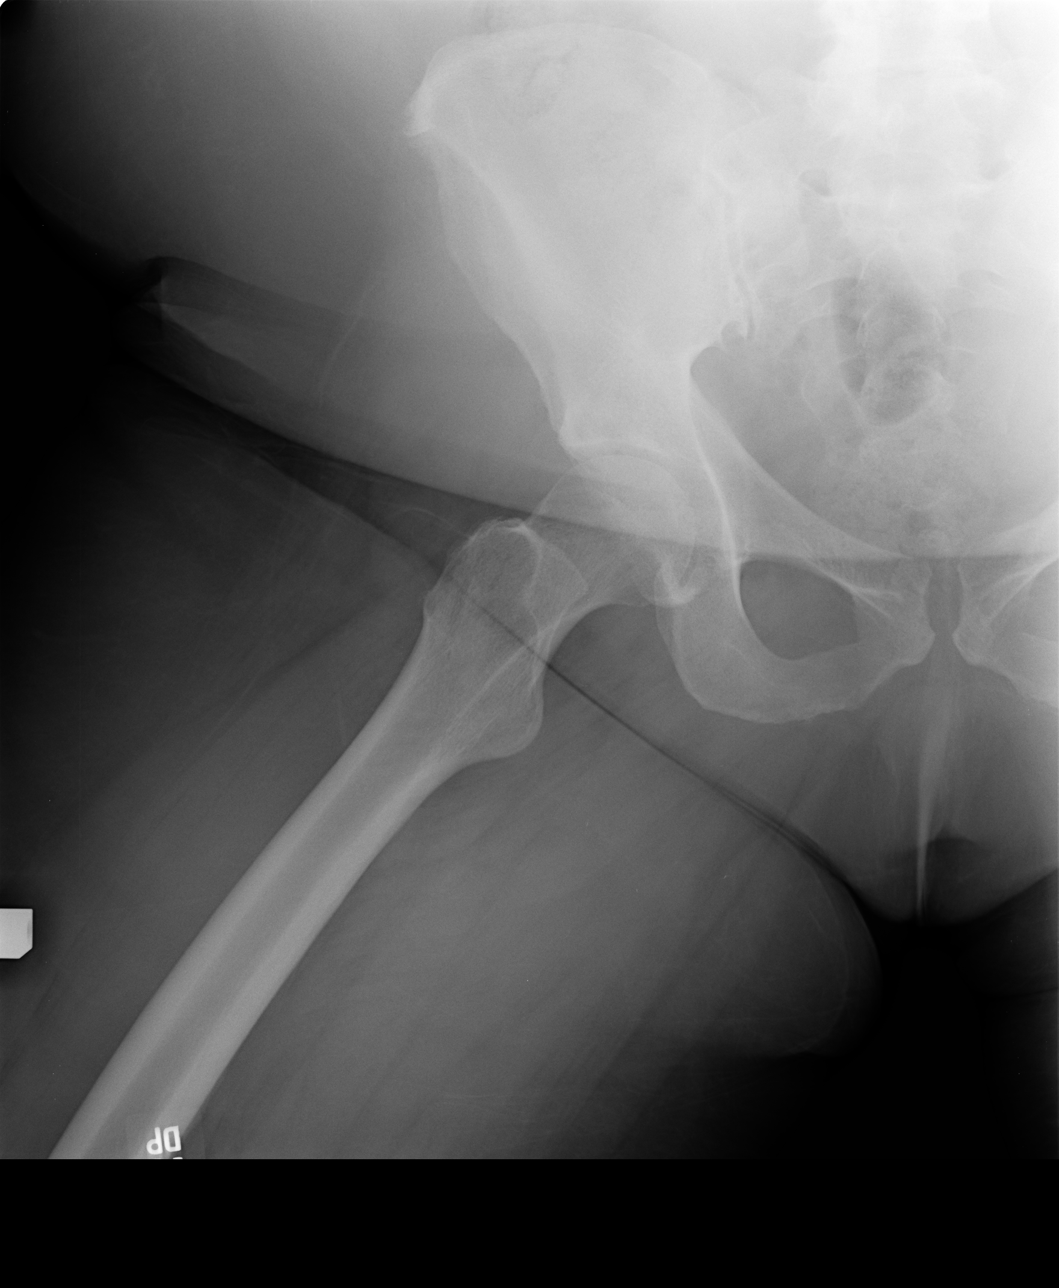

[view not recorded (3 of 4)]
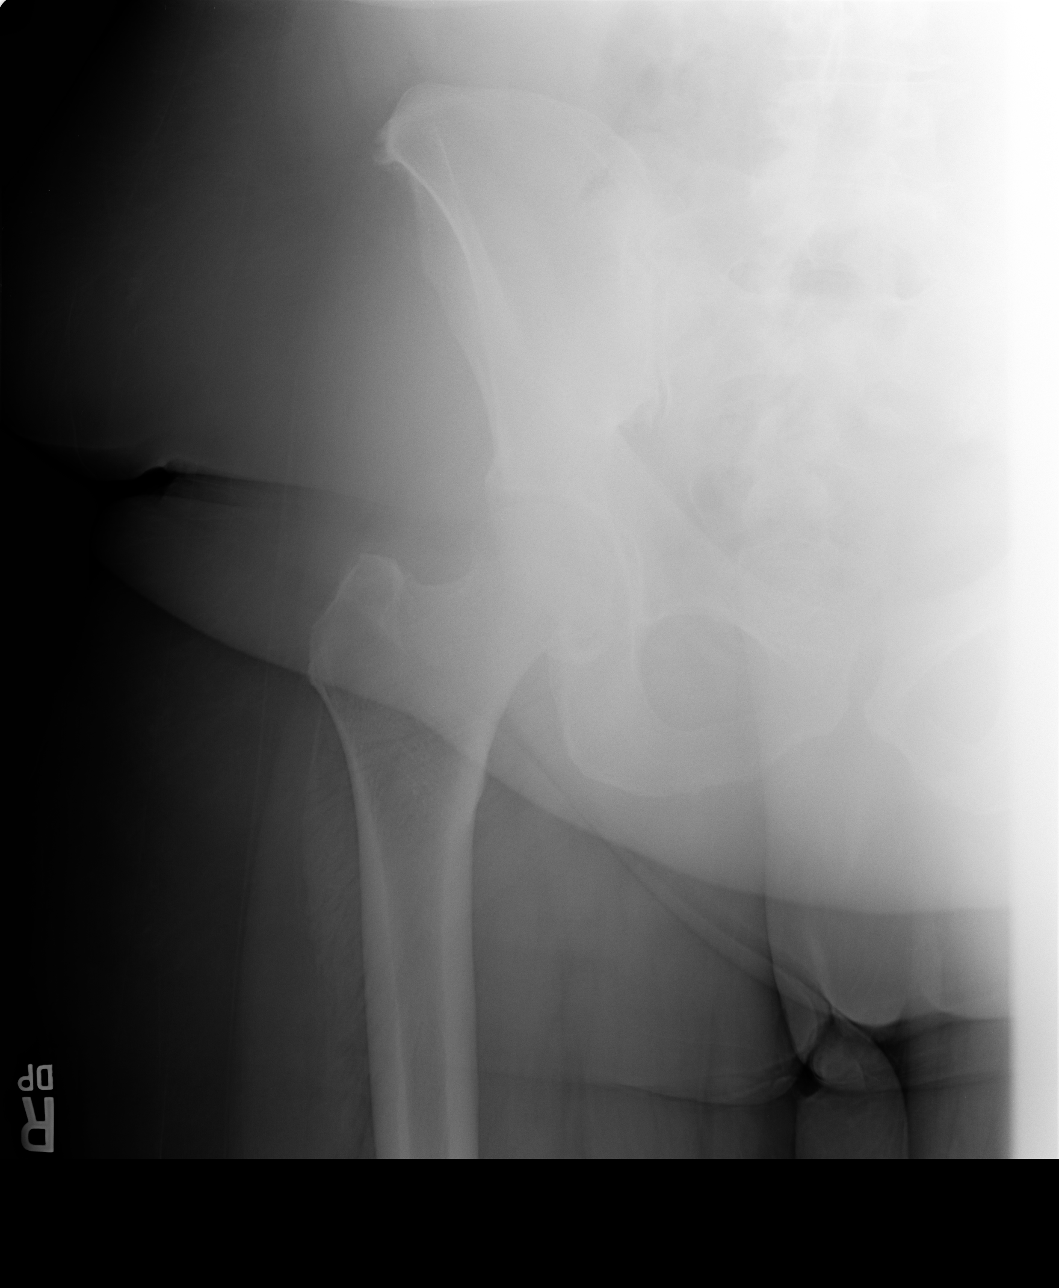

[view not recorded (4 of 4)]
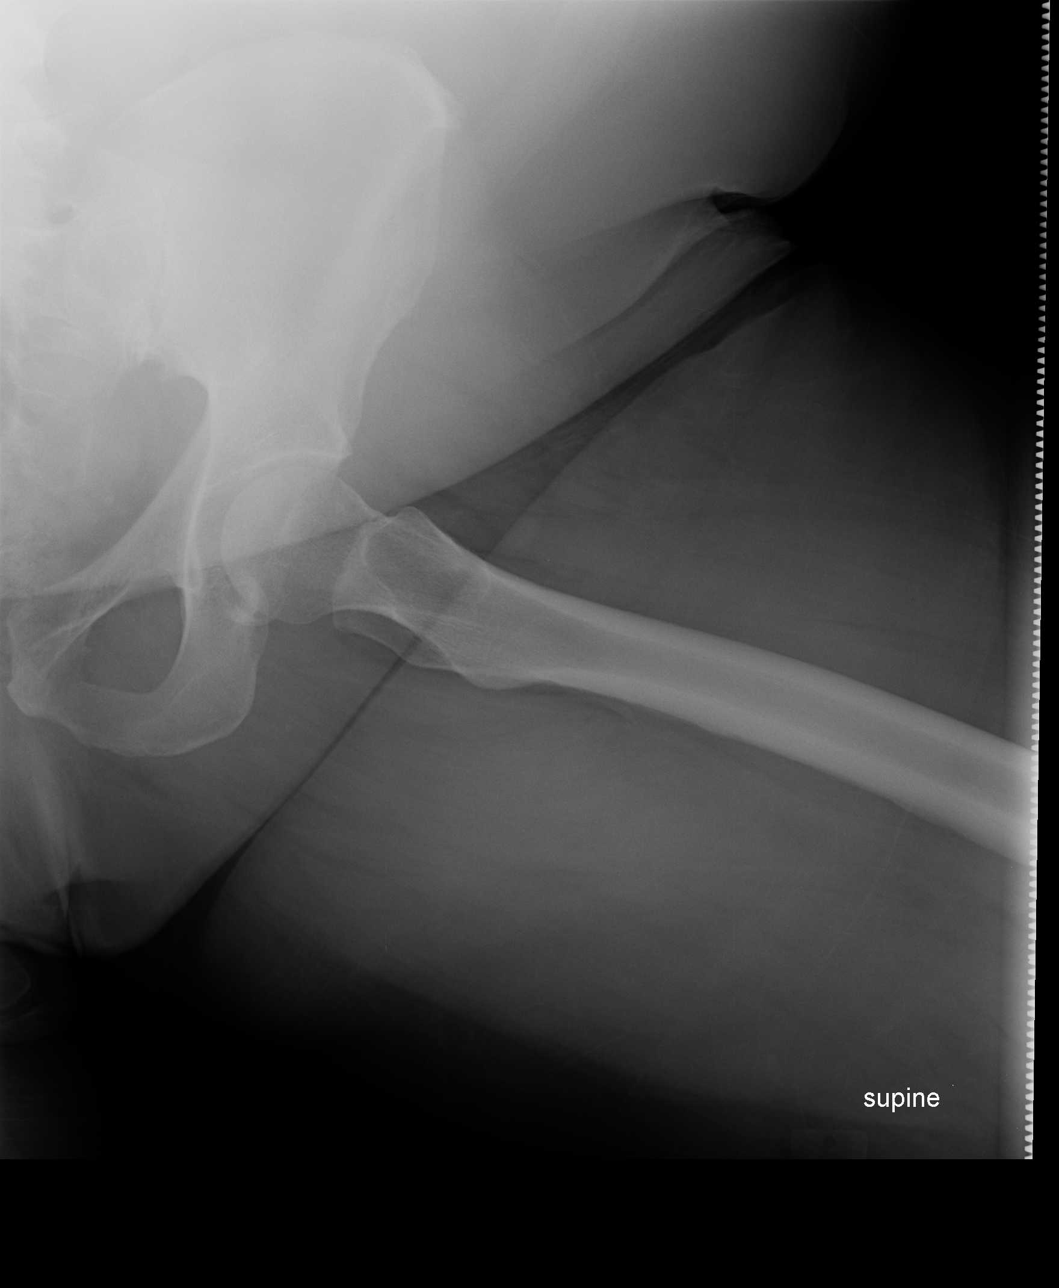

[4 of 4 positions shown; findings below may reference images not displayed]

FINDINGS: Two views bilateral ribs submitted. No acute fracture or
subluxation. No radiopaque foreign body. Bilateral hip joints are
symmetrical in appearance.
IMPRESSION: Negative.

## 2019-11-20 ENCOUNTER — Other Ambulatory Visit: Payer: Self-pay | Admitting: Family Medicine

## 2019-11-20 ENCOUNTER — Ambulatory Visit
Admission: RE | Admit: 2019-11-20 | Discharge: 2019-11-20 | Disposition: A | Discharge status: Home / Self Care | Payer: Medicare Other | Source: Ambulatory Visit | Attending: Family Medicine | Admitting: Family Medicine

## 2019-11-20 DIAGNOSIS — M5440 Lumbago with sciatica, unspecified side: Secondary | ICD-10-CM

## 2023-07-08 LAB — COLOGUARD: COLOGUARD: POSITIVE — AB

## 2024-01-11 NOTE — Progress Notes (Signed)
 Subjective   Patient ID:  Caitlin Moore is a 63 y.o. (DOB [birth year]) female.     Patient presents with  . Medicare Wellness Visit     Allergies  Allergen Reactions  . Methadone Seizures  . Atenolol Other and Nausea And Vomiting    Bad migraines migraine  . Lyrica Other    unknown  . Paxil Other    Tremors  . Zoloft Other    Tremors  . Propoxyphene N-Acetaminophen Nausea Only    No outpatient medications have been marked as taking for the 01/11/24 encounter (Medicare AWV) with Kip A Corrington, MD.    Past Medical History:  Diagnosis Date  . Allergy 2015   Dog dander,dust mites, cockroaches  . Anxiety 70's   Early Teen's  . Arthritis 2010  . Back pain   . Cataracts, bilateral 2018/2019   Both were removed  . Clotting disorder (*) 6 months ago   Started having pain and spotting about 6 months ago  . COPD (chronic obstructive pulmonary disease) (*)    Dr said she no longer has at last appointment 02/2017  . GERD (gastroesophageal reflux disease) 2005   Worse after gallbladder removal  . Hypertension 2018  . Migraines   . Neuromuscular disorder (*)    Past Surgical History:  Procedure Laterality Date  . Appendectomy  1975  . Cholecystectomy  10/16/2008  . Eye surgery Left 05/2017   cataract  . Scalenotomy w/o resection cervical rib    . Spine surgery  03/31/2003   Social History   Socioeconomic History  . Marital status: Significant Other  Tobacco Use  . Smoking status: Former    Current packs/day: 0.00    Average packs/day: 0.7 packs/day for 57.5 years (38.0 ttl pk-yrs)    Types: Cigarettes    Start date: 04/21/1969    Quit date: 04/21/2008    Years since quitting: 15.7    Passive exposure: Past  . Smokeless tobacco: Former  Advertising account planner  . Vaping status: Never Used  Substance and Sexual Activity  . Alcohol use: No  . Drug use: No  . Sexual activity: Not Currently    Partners: Female    Birth control/protection: Abstinence, Post-menopausal     Smoking history reviewed. Counseling given: Not Answered  Family History  Problem Relation Age of Onset  . Cancer Mother        Mother died from Lung Cancer  . Migraines Mother   . Alcohol abuse Mother   . Arthritis Mother   . Cancer Father        Father died from Malignant Brain Tumor  . No Known Problems Brother   . Depression Brother   . Migraines Brother   . Seizures Brother   . Cancer Brother        Passes away 07/13/2023 Cause Pending  . Seizures Maternal Aunt   . Migraines Maternal Grandmother   . Stroke Maternal Grandmother   . Migraines Maternal Grandfather   . Migraines Paternal Grandmother   . Migraines Paternal Grandfather   . Early death Brother        Drowned age [age]   Immunization History  Administered Date(s) Administered  . Influenza Flulaval Quad 0.56ml 05/22/2018, 04/02/2019  . Influenza Quad 0.73ml single-dose vial(Fluzone) 05/26/2015, 04/05/2016  . Influenza Tri 05/18/2012, 08/14/2012  . Influenza virus vaccine, unspecified formulation 04/02/2019  . Influenza, injectable, MDCK, preservative free, quadrivalent(FlucelvaxPF) 04/11/2017  . Pneumococcal Conjugate 13 (Prevnar) 06/24/2014  . Pneumococcal Polysaccharide (Pneumovax) 01/29/2013  .  Seasonal trivalent influenza vaccine, adjuvanted, preservative free 03/26/2013, 04/29/2014  . Tdap 01/06/2015  . Zoster Recombinant (Shingrix) 08/20/2019, 09/21/2019   Health Maintenance  Topic Date Due  . Diabetes Eye Exam  Never done  . Annual Spirometry Testing  08/03/2016  . Pneumococcal Vaccine: 50+ Years (3 of 3 - PCV20 or PCV21) 06/25/2019  . Mammogram  05/20/2020  . RSV Adult and Pregnancy (1 - Risk 60-74 years 1-dose series) Never done  . COVID-19 Vaccine (1 - 2024-25 season) Never done  . Medicare Annual Wellness  12/08/2023  . Diabetes Lipid Profile  02/28/2024  . Diabetes Hemoglobin A1C  04/12/2024  . Diabetes GFR/EGFR  05/30/2024  . ALT Level  05/30/2024  . Creatinine Level  05/30/2024  .  Potassium Level  05/30/2024  . AST Level  05/30/2024  . Hemoglobin  05/30/2024  . Diabetes Annual Microalbumin/Creatinine Ratio  05/30/2024  . Pap Smear  07/14/2024  . Sodium  08/30/2024  . Urine Drug Testing  08/30/2024  . Diabetes Foot Exam  10/10/2024  . DTaP/Tdap/Td Vaccines (2 - Td or Tdap) 01/05/2025  . Colorectal Cancer Screening  06/29/2026  . Zoster Vaccine  Completed  . COPD Spirometry Testing  Completed  . Meningococcal B Vaccine  Aged Out  . Hepatitis A Vaccine  Aged Out  . Meningococcal Conjugate Vaccine  Aged Out    ROS: Constitutional: No unexplained weight loss, fevers or night sweats. Fatigue Endocrine: no polyuria, polydipsia or polyphagia. Hematologic/lymphatic: does not bleed or bruise easily, no swollen or tender lymph nodes. Eyes: no blurry or double vision, no eye pain. ENT: no earache, hearing changes or tinnitus, no rhinorrhea, PND or sore throat. CV: HTN and tachycardia, no chest pain, palpitations or edema. Respiratory: no wheezing, or SOB with exertion, no cough. GI: no heartburn, reflux, anorexia, N/V or hematochezia. Breasts: no new or changing breast lumps, nipple changes or nipple dischargeGU: no dysuria, frequency or urgency, no hematuria, discharge or itching; . MS: arthralgia in low back and multiple other joints Neuro: no dizziness, numbness or headaches. Psych: mild depression, no mood swings, sleep pattern changes, work or family problems, drug or alcohol problems. Skin: no changes in moles or pigmentation.   Objective   BP 144/60 (BP Location: Left Upper Arm)   Pulse (!) 136   Temp 97 F (36.1 C) (Temporal)   Resp 20   Ht 5' 3 (1.6 m)   Wt 258 lb (117 kg)   LMP  (LMP Unknown)   SpO2 96%   Breastfeeding No   BMI 45.70 kg/m  General:  Well Developed, Well Nourished, No distress HEENT: Normocephalic, atraumatic, PERRLA, Conjunctiva normal Neck:  Supple with normal range of motion, No thyromegaly, bruits, or JVD  CV:  Heart with regular rate  and rhythm No murmurs audible Lungs:  CTA with normal effort no adventitious noises Abdomen:  Soft and Non-tender.  Extremities: 5/5 strength and resistance bilaterally, no clubbing cyanosis, or edema.  Neuro:  No focal deficits, CN II-XII intact, A&O x 3.    Skin:  No Focal Rashes, open lesions, or suspicious moles Psych:  Appears emotionally stable.  Good thought and speech quality.  Good eye contact.   Well groomed.  Well dressed. Depressed affect Medicare AWV on 01/11/2024  Component Date Value Ref Range Status  . Hemoglobin A1c 01/11/2024 6.5 (A)  4.8 - 5.6 % Final     Impression/ Plan      1. Type 2 diabetes mellitus with hyperglycemia, without long-term current use of  insulin (*)  POCT Hemoglobin A1C   semaglutide (RYBELSUS) 3 MG tablet    2. Opioid dependence in controlled environment (*)      3. Chronic pain syndrome      4. Lumbar radiculopathy, chronic      5. Hypertension, essential, benign  Comprehensive Metabolic Panel   hydroCHLOROthiazide 50 mg tablet   amLODIPine besylate (NORVASC) 10 mg tablet   Comprehensive Metabolic Panel    6. Medication management  ToxASSURE Select 13   ToxASSURE Select 13    7. Mixed hyperlipidemia  Lipid Panel With LDL/HDL Ratio   Lipid Panel With LDL/HDL Ratio       Healthy lifestyle recommendations discussed. Wellness handout given. Patient encouraged to exercise, monitor healthy diet, reduce stress when able, and take time for themselves.  Advised yearly pelvic exams, with Pap according ACOG guidelines per age, dental exams every 6 months, yearly eye exams, and dermatology exam as needed. Advise monthly self breast exams, yearly Mammogram if 40 or at risk, annual early flu immunization, colonoscopy at the age of 58, Zostavax at 50, and Pnuemovax at 80. Follow-up yearly for physical and labs unless concerns arise sooner and as recommended for chronic medical problems.  Risks, benefits, and alternatives of the medications and treatment  plan prescribed today were discussed, and patient expressed understanding. Plan follow-up as discussed or as needed if any new symptoms or change in chronic conditions.      Patient instructions and education given as below.  Labs ordered today and I will follow up with the patient with any additional recommendations once the results are available.    No follow-ups on file.    Patient's Medications       * Accurate as of January 11, 2024  4:09 PM. Reflects encounter med changes as of last refresh          New Prescriptions      Instructions  rosuvastatin calcium 20 mg tablet Commonly known as: CRESTOR Started by: Kip Corrington  20 mg, Oral, At bedtime       Continued Medications      Instructions  amLODIPine besylate 10 mg tablet Commonly known as: NORVASC  10 mg, Oral, Daily   amphetamine-dextroamphetamine 15 MG tablet Commonly known as: ADDERALL  1 tablet, Oral, 3 times a day   celecoxib 200 mg capsule Commonly known as: CELEBREX  TAKE 1 CAPSULE(200 MG) BY MOUTH TWICE DAILY   cycloSPORINE 0.05 % ophthalmic emulsion Commonly known as: RESTASIS  1 drop, Both Eyes, Every 12 hours   dexlansoprazole 60 mg capsule Commonly known as: DEXILANT,KAPIDEX  60 mg, Oral, Daily   diazepam 10 mg tablet Commonly known as: VALIUM  10 mg, Oral, Every 12 hours as needed   esomeprazole magnesium 40 mg capsule Commonly known as: NEXIUM  TAKE 1 CAPSULE BY MOUTH TWICE DAILY   hydroCHLOROthiazide 50 mg tablet  TAKE 1 TABLET BY MOUTH EVERY DAY   HYDROcodone-acetaminophen 10-325 mg per tablet Commonly known as: NORCO  1 tablet, Oral, Every 6 hours as needed   hydrOXYzine HCl 25 mg tablet Commonly known as: ATARAX  TAKE 1 TABLET BY MOUTH AS NEEDED FOR ITCHING Strength: 25 mg   irbesartan 300 MG tablet Commonly known as: AVAPRO  300 mg, Oral, At bedtime   levalbuterol 45 MCG/ACT inhaler Commonly known as: XOPENEX HFA  2 puffs, Inhalation, Every 6 hours as needed  respiratory   metoprolol succinate 25 mg 24 hr tablet Commonly known as: TOPROL-XL  25 mg, Oral,  Daily   morphine sulfate ER 60 mg 12 hr tablet Commonly known as: MS CONTIN  60 mg, Oral, 2 times a day   naloxone nasal spray (TAKE HOME PACK) Commonly known as: NARCAN  1 spray, Nasal, Once as needed   promethazine 12.5 MG tablet Commonly known as: PHENERGAN  12.5 mg, Oral, Every 8 hours as needed   rizatriptan 10 MG tablet Commonly known as: MAXALT  10 mg, Oral, As needed, May repeat in 2 hours if needed   semaglutide 3 MG tablet Commonly known as: RYBELSUS  3 mg, Oral, Daily   UNABLE TO FIND  Scooter  Dx: Chronic lumbar radiculopathy, M54.16       Discontinued Medications    simvastatin 20 mg tablet Commonly known as: ZOCOR Stopped by: Kip Corrington         Orders Placed This Encounter  Procedures  . Comprehensive Metabolic Panel  . ToxASSURE Select 13  . Lipid Panel With LDL/HDL Ratio  . POCT Hemoglobin A1C    Medication list was reviewed, updated, and a copy provided to patient upon leaving.    Handout given to the patient including the following instructions: There are no Patient Instructions on file for this visit.            *Some images could not be shown.

## 2024-01-15 NOTE — Progress Notes (Signed)
 Bo,  Your A1C is improved. Your kidney function has dropped some. Make sure and drink plenty of water daily (64 oz.)  Kip Corrington

## 2024-01-16 ENCOUNTER — Other Ambulatory Visit: Payer: Self-pay | Admitting: Obstetrics and Gynecology

## 2024-01-19 LAB — SURGICAL PATHOLOGY

## 2024-02-03 ENCOUNTER — Ambulatory Visit (INDEPENDENT_AMBULATORY_CARE_PROVIDER_SITE_OTHER): Admitting: Family Medicine

## 2024-02-03 ENCOUNTER — Encounter: Payer: Self-pay | Admitting: Family Medicine

## 2024-02-03 VITALS — BP 124/86 | Ht 63.5 in | Wt 258.0 lb

## 2024-02-03 DIAGNOSIS — Z780 Asymptomatic menopausal state: Secondary | ICD-10-CM

## 2024-02-03 DIAGNOSIS — E118 Type 2 diabetes mellitus with unspecified complications: Secondary | ICD-10-CM

## 2024-02-03 DIAGNOSIS — F909 Attention-deficit hyperactivity disorder, unspecified type: Secondary | ICD-10-CM

## 2024-02-03 DIAGNOSIS — M25551 Pain in right hip: Secondary | ICD-10-CM | POA: Diagnosis not present

## 2024-02-03 DIAGNOSIS — G8929 Other chronic pain: Secondary | ICD-10-CM | POA: Diagnosis not present

## 2024-02-03 DIAGNOSIS — F1120 Opioid dependence, uncomplicated: Secondary | ICD-10-CM | POA: Diagnosis not present

## 2024-02-03 DIAGNOSIS — M25552 Pain in left hip: Secondary | ICD-10-CM | POA: Diagnosis not present

## 2024-02-03 DIAGNOSIS — M160 Bilateral primary osteoarthritis of hip: Secondary | ICD-10-CM

## 2024-02-03 NOTE — Progress Notes (Signed)
 Caitlin Moore - 63 y.o. female MRN [id number]  Date of birth: Nov 29, 1960    SUBJECTIVE:     Discuss need for hip replacement  Chief Complaint:/ HPI:  - Patient returns to my [facility] practice for discussion of her right hip pain. I last saw her in 2018.   - Since then she has had continued worsening of her medical issues and of her OA pain in her hip. In her 31s, she had some type of impact injury to that hip and has had progressive pain since. Recently saw a surgeon and had evaluation. They did X ray (report available, images are not). Surgeon recommended joint replacement. She is aware of her co-morbidities and seeks advice.  - Hip pain is disabling to the point she can only walk with cane for a distance of about 25 feet. Needs assistance with some ADLs (dressing: socks and pants; getting in and out of tub or shower.) and with almost all household chores as she cannot bend down or get on floor or lean over. - She is very frustrated at this point as she knows her current weight may preclude surgery any time soon, yet she is pretty much bed bound and losing weight has been problematic.  - recently had GYN procedures to evaluate post menopausal bleeding and getting her into Lithotomy position was not only difficult, it seemed to significantly aggravate her hip pain. Dx with cervical polyp.  - She was recently started on GLP 1 agonist. (Tirzepatide) for her DM,last A1C =6.5%. - She has been on chronic opioid therapy for years, managed by her PCP, Dr. Bobbette.   PERTINENT  PMH / PSH: I have reviewed the patient's medications, allergies, past medical and surgical history, smoking status.  Pertinent findings that relate to today's visit / issues include:  Right hip XRay 12/28/2022 at [facility]: FINDINGS:  The bones are intact. The femoral head is positioned within the acetabulum. There is complete obliteration of the joint space. Subchondral sclerosis of the acetabulum. Subchondral cyst  formation is present within the acetabulum and femoral head. Osteophyte formation is noted inferiorly. Fair range of motion on the frog-leg view. Soft tissues are unremarkable.  - LEFT hip 12/28/2022: Moderate degenerative changes in the left hip  No definite fracture, dislocation, or aggressive lesion seen.   Does not smoke tobacco, does smoke MJ regularly. Has pain contract with Dr Bobbette [facility])  Current outpatient medications: Morphine sulfate ER 60 mg BID (PCP) Diazepam 10 mg TID (psych) Hydrocodone acetaminophen 10-325 q6 prn (PCP) Dextroamphetamine-Amphetamine 15 mg TID (psych) Amlodipine 10 mgQD Metoprolol ER 25 QD Irbesartan 300 mg QD Restasis  Simvastatin 20 mg every day (may have changed to Rosuvastatin) Omeprazole 40 mg BID (or possibly changed to Dexilant, unsure) Albuterol MDI Rizatriptan for migraines prn    OBJECTIVE: BP 124/86   Ht 5' 3.5 (1.613 m)   Wt 258 lb (117 kg)   BMI 44.99 kg/m   Physical Exam:  Vital signs are reviewed. GEN WD WN BMI 45 GAIT: unsteady,short stepped, antalgic, uses cane. Needs assistance on and off table, PSYCH: AxOx4. Good eye contact.. No psychomotor retardation or agitation. Appropriate speech fluency and content. Asks and answers questions appropriately. Mood is congruent.   ASSESSMENT & PLAN: Severe hip OA Needs hip replacement Co-morbidities of obesity and multiple hi risk medications may complicate her getting this done in near future. I will ask my ortho colleagues for advice.  I will get back with her early next week via  phone.   45 minutes total time spent including: patient interview, examination, review of chart and history, development of differential diagnoses, decision about testing, discussion of treatment and management options with patient and coordination of care.  No problem-specific Assessment & Plan notes found for this encounter.

## 2024-02-03 NOTE — Progress Notes (Signed)
 PCP: Corrington, Kip A, MD  Subjective:   HPI: Patient is a 63 y.o. female here for chronic bilateral hip pain.  Pain has been present for at least 10 years and is most prominent along the anterior aspect of the right hip.  She has a known history of osteoarthritis in both hips.  Imaging from June 2024 showed obliteration of the joint space in the right hip and moderate degenerative changes in the left hip.  She recently underwent a gynecologic procedure that was technically difficult because of the significant reduction in the range of motion of her hips.  She was told that she should consider hip replacement.  She is currently managing pain with morphine/hydrocodone and massage.  Past Medical History:  Diagnosis Date   Anxiety    Carpal tunnel syndrome    Chronic pain disorder    COPD (chronic obstructive pulmonary disease) (HCC)     Current Outpatient Medications on File Prior to Visit  Medication Sig Dispense Refill   amphetamine-dextroamphetamine (ADDERALL) 10 MG tablet Take 10 mg by mouth.     amphetamine-dextroamphetamine (ADDERALL) 15 MG tablet TK 1 T PO BID  0   azithromycin (ZITHROMAX) 250 MG tablet   0   budesonide-formoterol (SYMBICORT) 160-4.5 MCG/ACT inhaler INHALE 2 PUFFS BY MOUTH TWICE DAILY     celecoxib (CELEBREX) 200 MG capsule Take 200 mg by mouth 2 (two) times daily.     dexlansoprazole (DEXILANT) 60 MG capsule TAKE 1 CAPSULE BY MOUTH EVERY DAY     diazepam (VALIUM) 10 MG tablet Take 10 mg by mouth every 6 (six) hours as needed for anxiety.     doxycycline (DORYX) 100 MG DR capsule Take 100 mg by mouth 2 (two) times daily.     doxycycline (DORYX) 100 MG EC tablet TK 1 T PO BID  0   doxycycline (VIBRAMYCIN) 100 MG capsule Take 100 mg by mouth.     esomeprazole (NEXIUM) 40 MG capsule Take 40 mg by mouth daily at 12 noon.     fluticasone (FLONASE) 50 MCG/ACT nasal spray INHALE 1 SPRAY  IEN DAILY  12   hydrochlorothiazide (HYDRODIURIL) 50 MG tablet Take by mouth.      HYDROcodone-acetaminophen (NORCO) 10-325 MG per tablet Take 1 tablet by mouth every 6 (six) hours as needed.     hydrOXYzine (ATARAX/VISTARIL) 25 MG tablet TAKE 1 TABLET BY MOUTH AS NEEDED FOR ITCHING     ketorolac (ACULAR) 0.4 % SOLN      levalbuterol (XOPENEX HFA) 45 MCG/ACT inhaler Inhale into the lungs every 4 (four) hours as needed for wheezing.     lidocaine (LIDODERM) 5 % Place 1 patch onto the skin daily. Remove & Discard patch within 12 hours or as directed by MD     mometasone (NASONEX) 50 MCG/ACT nasal spray Place 2 sprays into the nose daily.     montelukast (SINGULAIR) 10 MG tablet TAKE 1 TABLET BY MOUTH EVERY NIGHT AT BEDTIME     morphine (MS CONTIN) 60 MG 12 hr tablet Take 2 tablets in the am, one at noon, and 2 in the pm     ofloxacin (OCUFLOX) 0.3 % ophthalmic solution      prednisoLONE acetate (PRED FORTE) 1 % ophthalmic suspension      ranitidine (ZANTAC) 300 MG tablet Take 300 mg by mouth at bedtime.     rizatriptan (MAXALT) 10 MG tablet Take 10 mg by mouth as needed for migraine. May repeat in 2 hours if needed  simvastatin (ZOCOR) 20 MG tablet TAKE 1 TABLET BY MOUTH EVERY EVENING     SUMAtriptan (IMITREX) 5 MG/ACT nasal spray Place 1 spray into the nose every 2 (two) hours as needed for migraine.     Tiotropium Bromide Monohydrate (SPIRIVA RESPIMAT) 2.5 MCG/ACT AERS INHALE 1 PUFF BY MOUTH ONCE A DAY     triamcinolone cream (KENALOG) 0.5 % Apply 1 application topically 3 (three) times daily.     valsartan (DIOVAN) 160 MG tablet Take 160 mg by mouth daily.     No current facility-administered medications on file prior to visit.    Past Surgical History:  Procedure Laterality Date   BACK SURGERY     CATARACT EXTRACTION Bilateral     Allergies  Allergen Reactions   Atenolol Nausea And Vomiting    Bad migraines   Cymbalta [Duloxetine Hcl]    Lyrica [Pregabalin]    Methadone    Paroxetine    Paxil [Paroxetine Hcl]    Propoxyphene N-Acetaminophen    Sertraline  Hcl    Zoloft [Sertraline Hcl]     BP 124/86   Ht 5' 3.5 (1.613 m)   Wt 258 lb (117 kg)   BMI 44.99 kg/m       No data to display              No data to display              Objective:  Physical Exam:  Gen: NAD, comfortable in exam room, antalgic gait, ambulates with a cane  Bilateral hips No obvious deformity on inspection of each hip Significantly reduced range of motion in the right hip, flexion limited to 20 degrees, abduction limited to 30 degrees, extension limited to 45 degrees Flexion is reduced to 45 degrees in the left hip, abduction and extension are generally intact There is tenderness to palpation over the greater trochanteric bursa bilaterally   Assessment & Plan:  1.  Chronic bilateral hip pain, bilateral hip osteoarthritis With known history of bilateral hip arthritis.  Imaging from June 2024 shows obliteration of the joint space in the right hip.  Degenerative changes are milder in the left hip.  She is interested in arthroplasty, which we agree is appropriate given advanced degeneration and pain, but will be difficult given her comorbidities.  Dr. Rosalynn will discuss with orthopedic surgery to determine the most appropriate location / service to put Ms. Buckholtz in contact with.

## 2024-02-04 DIAGNOSIS — E118 Type 2 diabetes mellitus with unspecified complications: Secondary | ICD-10-CM | POA: Insufficient documentation

## 2024-02-04 DIAGNOSIS — Z780 Asymptomatic menopausal state: Secondary | ICD-10-CM | POA: Insufficient documentation

## 2024-02-04 DIAGNOSIS — F1120 Opioid dependence, uncomplicated: Secondary | ICD-10-CM | POA: Insufficient documentation

## 2024-02-04 DIAGNOSIS — G8929 Other chronic pain: Secondary | ICD-10-CM | POA: Insufficient documentation

## 2024-02-04 DIAGNOSIS — F909 Attention-deficit hyperactivity disorder, unspecified type: Secondary | ICD-10-CM | POA: Insufficient documentation

## 2024-02-08 ENCOUNTER — Telehealth: Payer: Self-pay | Admitting: Family Medicine

## 2024-02-08 NOTE — Telephone Encounter (Signed)
 LVM Retry tomorrow

## 2024-02-13 NOTE — Telephone Encounter (Signed)
 CALLED. NO ANSWER NO ABILITY TO LVM.  WILL TRY AGAIN TOMORROW

## 2024-02-15 ENCOUNTER — Encounter: Payer: Self-pay | Admitting: Family Medicine

## 2024-05-01 ENCOUNTER — Other Ambulatory Visit: Payer: Self-pay | Admitting: Orthopedic Surgery

## 2024-05-07 NOTE — Progress Notes (Signed)
 Sent message, via epic in basket, requesting orders in epic from Careers adviser.

## 2024-05-08 NOTE — Progress Notes (Signed)
 Please place order for PAT appointment scheduled 05/09/24.

## 2024-05-09 ENCOUNTER — Encounter (HOSPITAL_COMMUNITY)
Admission: RE | Admit: 2024-05-09 | Discharge: 2024-05-09 | Disposition: A | Discharge status: Home / Self Care | Source: Ambulatory Visit | Attending: Orthopedic Surgery | Admitting: Orthopedic Surgery

## 2024-05-09 ENCOUNTER — Encounter (HOSPITAL_COMMUNITY): Payer: Self-pay

## 2024-05-09 ENCOUNTER — Other Ambulatory Visit: Payer: Self-pay

## 2024-05-09 VITALS — BP 163/86 | HR 98 | Temp 97.4°F | Resp 16 | Ht 63.5 in | Wt 253.0 lb

## 2024-05-09 DIAGNOSIS — E118 Type 2 diabetes mellitus with unspecified complications: Secondary | ICD-10-CM | POA: Diagnosis not present

## 2024-05-09 DIAGNOSIS — Z01812 Encounter for preprocedural laboratory examination: Secondary | ICD-10-CM | POA: Insufficient documentation

## 2024-05-09 DIAGNOSIS — Z01818 Encounter for other preprocedural examination: Secondary | ICD-10-CM

## 2024-05-09 HISTORY — DX: Type 2 diabetes mellitus without complications: E11.9

## 2024-05-09 HISTORY — DX: Gastro-esophageal reflux disease without esophagitis: K21.9

## 2024-05-09 HISTORY — DX: Headache, unspecified: R51.9

## 2024-05-09 HISTORY — DX: Essential (primary) hypertension: I10

## 2024-05-09 HISTORY — DX: Unspecified osteoarthritis, unspecified site: M19.90

## 2024-05-09 LAB — CBC
HCT: 44.5 % (ref 36.0–46.0)
Hemoglobin: 13.5 g/dL (ref 12.0–15.0)
MCH: 28.5 pg (ref 26.0–34.0)
MCHC: 30.3 g/dL (ref 30.0–36.0)
MCV: 93.9 fL (ref 80.0–100.0)
Platelets: 337 K/uL (ref 150–400)
RBC: 4.74 MIL/uL (ref 3.87–5.11)
RDW: 12.5 % (ref 11.5–15.5)
WBC: 9.5 K/uL (ref 4.0–10.5)
nRBC: 0 % (ref 0.0–0.2)

## 2024-05-09 LAB — BASIC METABOLIC PANEL WITH GFR
Anion gap: 10 (ref 5–15)
BUN: 31 mg/dL — ABNORMAL HIGH (ref 8–23)
CO2: 26 mmol/L (ref 22–32)
Calcium: 9.8 mg/dL (ref 8.9–10.3)
Chloride: 99 mmol/L (ref 98–111)
Creatinine, Ser: 1.35 mg/dL — ABNORMAL HIGH (ref 0.44–1.00)
GFR, Estimated: 44 mL/min — ABNORMAL LOW (ref >60)
Glucose, Bld: 152 mg/dL — ABNORMAL HIGH (ref 70–99)
Potassium: 4.7 mmol/L (ref 3.5–5.1)
Sodium: 135 mmol/L (ref 135–145)

## 2024-05-09 LAB — GLUCOSE, CAPILLARY: Glucose-Capillary: 165 mg/dL — ABNORMAL HIGH (ref 70–99)

## 2024-05-09 LAB — SURGICAL PCR SCREEN
MRSA, PCR: NEGATIVE
Staphylococcus aureus: NEGATIVE

## 2024-05-09 NOTE — Patient Instructions (Addendum)
 SURGICAL WAITING ROOM VISITATION  Patients having surgery or a procedure may have no more than 2 support people in the waiting area - these visitors may rotate.    Children under the age of 61 must have an adult with them who is not the patient.  Visitors with respiratory illnesses are discouraged from visiting and should remain at home.  If the patient needs to stay at the hospital during part of their recovery, the visitor guidelines for inpatient rooms apply. Pre-op nurse will coordinate an appropriate time for 1 support person to accompany patient in pre-op.  This support person may not rotate.    Please refer to the [facility] website for the visitor guidelines for Inpatients (after your surgery is over and you are in a regular room).    Your procedure is scheduled on: 05/14/24   Report to [facility] Main Entrance    Report to admitting at 8:35 AM   Call this number if you have problems the morning of surgery [phone]   Do not eat food  or drink liquids :After Midnight.                    If you have questions, please contact your surgeon's office.   FOLLOW BOWEL PREP AND ANY ADDITIONAL PRE OP INSTRUCTIONS YOU RECEIVED FROM YOUR SURGEON'S OFFICE!!!     Oral Hygiene is also important to reduce your risk of infection.                                    Remember - BRUSH YOUR TEETH THE MORNING OF SURGERY WITH YOUR REGULAR TOOTHPASTE  DENTURES WILL BE REMOVED PRIOR TO SURGERY PLEASE DO NOT APPLY Poly grip OR ADHESIVES!!!   Stop all vitamins and herbal supplements 7 days before surgery.   Take these medicines the morning of surgery with A SIP OF WATER: Amlodipine, Diazepam, Nexium, Norco, nebulizer/inhalers, metoprolol, Morphine, Phenergan    How to Manage Your Diabetes Before and After Surgery  Why is it important to control my blood sugar before and after surgery? Improving blood sugar levels before and after surgery helps healing and can limit problems. A  way of improving blood sugar control is eating a healthy diet by:  Eating less sugar and carbohydrates  Increasing activity/exercise  Talking with your doctor about reaching your blood sugar goals High blood sugars (greater than 180 mg/dL) can raise your risk of infections and slow your recovery, so you will need to focus on controlling your diabetes during the weeks before surgery. Make sure that the doctor who takes care of your diabetes knows about your planned surgery including the date and location.  How do I manage my blood sugar before surgery? Check your blood sugar at least 4 times a day, starting 2 days before surgery, to make sure that the level is not too high or low. Check your blood sugar the morning of your surgery when you wake up and every 2 hours until you get to the Short Stay unit. If your blood sugar is less than 70 mg/dL, you will need to treat for low blood sugar: Do not take insulin. Treat a low blood sugar (less than 70 mg/dL) with  cup of clear juice (cranberry or apple), 4 glucose tablets, OR glucose gel. Recheck blood sugar in 15 minutes after treatment (to make sure it is greater than 70 mg/dL). If your blood  sugar is not greater than 70 mg/dL on recheck, call [phone] for further instructions. Report your blood sugar to the short stay nurse when you get to Short Stay.  If you are admitted to the hospital after surgery: Your blood sugar will be checked by the staff and you will probably be given insulin after surgery (instead of oral diabetes medicines) to make sure you have good blood sugar levels. The goal for blood sugar control after surgery is 80-180 mg/dL.   WHAT DO I DO ABOUT MY DIABETES MEDICATION?  Do not take oral diabetes medicines (pills) the morning of surgery.  THE NIGHT BEFORE SURGERY, do not take Rybelsus     Reviewed and Endorsed by [facility] Patient Education Committee, August 2015                              You may not have any  metal on your body including hair pins, jewelry, and body piercing             Do not wear make-up, lotions, powders, perfumes, or deodorant  Do not wear nail polish including gel and S&S, artificial/acrylic nails, or any other type of covering on natural nails including finger and toenails. If you have artificial nails, gel coating, etc. that needs to be removed by a nail salon please have this removed prior to surgery or surgery may need to be canceled/ delayed if the surgeon/ anesthesia feels like they are unable to be safely monitored.   Do not shave  48 hours prior to surgery.    Do not bring valuables to the hospital. [facility] IS NOT             RESPONSIBLE   FOR VALUABLES.   Contacts, glasses, dentures or bridgework may not be worn into surgery.   Bring small overnight bag day of surgery.   DO NOT BRING YOUR HOME MEDICATIONS TO THE HOSPITAL. PHARMACY WILL DISPENSE MEDICATIONS LISTED ON YOUR MEDICATION LIST TO YOU DURING YOUR ADMISSION IN THE HOSPITAL!    Special Instructions: Bring a copy of your healthcare power of attorney and living will documents the day of surgery if you haven't scanned them before.              Please read over the following fact sheets you were given: IF YOU HAVE QUESTIONS ABOUT YOUR PRE-OP INSTRUCTIONS PLEASE CALL [phone].   If you received a COVID test during your pre-op visit  it is requested that you wear a mask when out in public, stay away from anyone that may not be feeling well and notify your surgeon if you develop symptoms. If you test positive for Covid or have been in contact with anyone that has tested positive in the last 10 days please notify you surgeon.    Pre-operative 4 CHG Bath Instructions  DYNA-Hex 4 Chlorhexidine Gluconate 4% Solution Antiseptic 4 fl. oz   You can play a key role in reducing the risk of infection after surgery. Your skin needs to be as free of germs as possible. You can reduce the number of germs on  your skin by washing with CHG (chlorhexidine gluconate) soap before surgery. CHG is an antiseptic soap that kills germs and continues to kill germs even after washing.   DO NOT use if you have an allergy to chlorhexidine/CHG or antibacterial soaps. If your skin becomes reddened or irritated, stop using the CHG and notify one  of our RNs at   Please shower with the CHG soap starting 4 days before surgery using the following schedule:     Please keep in mind the following:  DO NOT shave, including legs and underarms, starting the day of your first shower.   You may shave your face at any point before/day of surgery.  Place clean sheets on your bed the day you start using CHG soap. Use a clean washcloth (not used since being washed) for each shower. DO NOT sleep with pets once you start using the CHG.  CHG Shower Instructions:  If you choose to wash your hair and private area, wash first with your normal shampoo/soap.  After you use shampoo/soap, rinse your hair and body thoroughly to remove shampoo/soap residue.  Turn the water OFF and apply about 3 tablespoons (45 ml) of CHG soap to a CLEAN washcloth.  Apply CHG soap ONLY FROM YOUR NECK DOWN TO YOUR TOES (washing for 3-5 minutes)  DO NOT use CHG soap on face, private areas, open wounds, or sores.  Pay special attention to the area where your surgery is being performed.  If you are having back surgery, having someone wash your back for you may be helpful. Wait 2 minutes after CHG soap is applied, then you may rinse off the CHG soap.  Pat dry with a clean towel  Put on clean clothes/pajamas   If you choose to wear lotion, please use ONLY the CHG-compatible lotions on the back of this paper.     Additional instructions for the day of surgery: DO NOT APPLY any lotions, deodorants, cologne, or perfumes.   Put on clean/comfortable clothes.  Brush your teeth.  Ask your nurse before applying any prescription medications to the skin.   CHG  Compatible Lotions   Aveeno Moisturizing lotion  Cetaphil Moisturizing Cream  Cetaphil Moisturizing Lotion  Clairol Herbal Essence Moisturizing Lotion, Dry Skin  Clairol Herbal Essence Moisturizing Lotion, Extra Dry Skin  Clairol Herbal Essence Moisturizing Lotion, Normal Skin  Curel Age Defying Therapeutic Moisturizing Lotion with Alpha Hydroxy  Curel Extreme Care Body Lotion  Curel Soothing Hands Moisturizing Hand Lotion  Curel Therapeutic Moisturizing Cream, Fragrance-Free  Curel Therapeutic Moisturizing Lotion, Fragrance-Free  Curel Therapeutic Moisturizing Lotion, Original Formula  Eucerin Daily Replenishing Lotion  Eucerin Dry Skin Therapy Plus Alpha Hydroxy Crme  Eucerin Dry Skin Therapy Plus Alpha Hydroxy Lotion  Eucerin Original Crme  Eucerin Original Lotion  Eucerin Plus Crme Eucerin Plus Lotion  Eucerin TriLipid Replenishing Lotion  Keri Anti-Bacterial Hand Lotion  Keri Deep Conditioning Original Lotion Dry Skin Formula Softly Scented  Keri Deep Conditioning Original Lotion, Fragrance Free Sensitive Skin Formula  Keri Lotion Fast Absorbing Fragrance Free Sensitive Skin Formula  Keri Lotion Fast Absorbing Softly Scented Dry Skin Formula  Keri Original Lotion  Keri Skin Renewal Lotion Keri Silky Smooth Lotion  Keri Silky Smooth Sensitive Skin Lotion  Nivea Body Creamy Conditioning Oil  Nivea Body Extra Enriched Teacher, adult education Moisturizing Lotion Nivea Crme  Nivea Skin Firming Lotion  NutraDerm 30 Skin Lotion  NutraDerm Skin Lotion  NutraDerm Therapeutic Skin Cream  NutraDerm Therapeutic Skin Lotion  ProShield Protective Hand Cream  Provon moisturizing lotion WHAT IS A BLOOD TRANSFUSION? Blood Transfusion Information  A transfusion is the replacement of blood or some of its parts. Blood is made up of multiple cells which provide different functions. Red blood cells carry oxygen and are used for blood loss replacement. White  blood cells fight against infection. Platelets control bleeding. Plasma helps clot blood. Other blood products are available for specialized needs, such as hemophilia or other clotting disorders. BEFORE THE TRANSFUSION  Who gives blood for transfusions?  Healthy volunteers who are fully evaluated to make sure their blood is safe. This is blood bank blood. Transfusion therapy is the safest it has ever been in the practice of medicine. Before blood is taken from a donor, a complete history is taken to make sure that person has no history of diseases nor engages in risky social behavior (examples are intravenous drug use or sexual activity with multiple partners). The donor's travel history is screened to minimize risk of transmitting infections, such as malaria. The donated blood is tested for signs of infectious diseases, such as HIV and hepatitis. The blood is then tested to be sure it is compatible with you in order to minimize the chance of a transfusion reaction. If you or a relative donates blood, this is often done in anticipation of surgery and is not appropriate for emergency situations. It takes many days to process the donated blood. RISKS AND COMPLICATIONS Although transfusion therapy is very safe and saves many lives, the main dangers of transfusion include:  Getting an infectious disease. Developing a transfusion reaction. This is an allergic reaction to something in the blood you were given. Every precaution is taken to prevent this. The decision to have a blood transfusion has been considered carefully by your caregiver before blood is given. Blood is not given unless the benefits outweigh the risks. AFTER THE TRANSFUSION Right after receiving a blood transfusion, you will usually feel much better and more energetic. This is especially true if your red blood cells have gotten low (anemic). The transfusion raises the level of the red blood cells which carry oxygen, and this usually causes  an energy increase. The nurse administering the transfusion will monitor you carefully for complications. HOME CARE INSTRUCTIONS  No special instructions are needed after a transfusion. You may find your energy is better. Speak with your caregiver about any limitations on activity for underlying diseases you may have. SEEK MEDICAL CARE IF:  Your condition is not improving after your transfusion. You develop redness or irritation at the intravenous (IV) site. SEEK IMMEDIATE MEDICAL CARE IF:  Any of the following symptoms occur over the next 12 hours: Shaking chills. You have a temperature by mouth above 102 F (38.9 C), not controlled by medicine. Chest, back, or muscle pain. People around you feel you are not acting correctly or are confused. Shortness of breath or difficulty breathing. Dizziness and fainting. You get a rash or develop hives. You have a decrease in urine output. Your urine turns a dark color or changes to pink, red, or brown. Any of the following symptoms occur over the next 10 days: You have a temperature by mouth above 102 F (38.9 C), not controlled by medicine. Shortness of breath. Weakness after normal activity. The white part of the eye turns yellow (jaundice). You have a decrease in the amount of urine or are urinating less often. Your urine turns a dark color or changes to pink, red, or brown. Document Released: 07/02/2000 Document Revised: 09/27/2011 Document Reviewed: 02/19/2008 [facility] Patient Information 2014 [place], [place].  _______________________________________________________________________

## 2024-05-09 NOTE — Progress Notes (Addendum)
 COVID Vaccine Completed:  Date of COVID positive in last 90 days:  PCP - Coye Horseman, MD Cardiologist -  Pulmonologist- Gregory Blaze, MD has not seen since 2019  Clearance  by Dr. Horseman 04/04/24 in Epic  Chest x-ray - N/A EKG - 04/04/24 [facility] Stress Test - n/a ECHO - N/A Cardiac Cath - n/a Pacemaker/ICD device last checked:N/A Spinal Cord Stimulator:N/A  Bowel Prep - N/A  Sleep Study - yes CPAP - no per pt preference   Fasting Blood Sugar - 120-150 Checks Blood Sugar once a month  Last dose of GLP1 agonist-  Rybelsus  GLP1 instructions:  Do not take night before surgery   Last dose of SGLT-2 inhibitors-  N/A SGLT-2 instructions:  Do not take after     Blood Thinner Instructions: N/A Last dose:   Time: Aspirin Instructions:N/A Last Dose:  Activity level: Can perform activities of daily living without stopping and without symptoms of chest pain or shortness of breath. Cannot do stairs due to hip injury. Endorses SOB, not new or changing.  Anesthesia review: COPD, DM2, HTN, opioid dependence  Patient denies shortness of breath, fever, cough and chest pain at PAT appointment  Patient verbalized understanding of instructions that were given to them at the PAT appointment. Patient was also instructed that they will need to review over the PAT instructions again at home before surgery.

## 2024-05-13 DIAGNOSIS — S72059A Unspecified fracture of head of unspecified femur, initial encounter for closed fracture: Principal | ICD-10-CM | POA: Diagnosis present

## 2024-05-13 NOTE — Anesthesia Preprocedure Evaluation (Signed)
 Anesthesia Evaluation  Patient identified by MRN, date of birth, ID band Patient awake    Reviewed: Allergy & Precautions, NPO status , Patient's Chart, lab work & pertinent test results, reviewed documented beta blocker date and time   Airway Mallampati: II  TM Distance: >3 FB Neck ROM: Full    Dental  (+) Dental Advisory Given, Partial Lower, Partial Upper   Pulmonary COPD, former smoker   breath sounds clear to auscultation + decreased breath sounds      Cardiovascular hypertension, Pt. on medications Normal cardiovascular exam Rhythm:Regular Rate:Normal     Neuro/Psych  Headaches PSYCHIATRIC DISORDERS Anxiety      Neuromuscular disease    GI/Hepatic ,GERD  ,,(+)     substance abuse  Opioid dependence   Endo/Other  diabetes, Well Controlled, Type 2  Class 3 obesityGLP-1 RA therapy (Oral ) - last dose yesterday  Renal/GU Renal InsufficiencyRenal disease  negative genitourinary   Musculoskeletal  (+) Arthritis , Osteoarthritis,  narcotic dependentRight hip femoral head Fx Chronic back pain   Abdominal  (+) + obese  Peds  Hematology   Anesthesia Other Findings   Reproductive/Obstetrics                              Anesthesia Physical Anesthesia Plan  ASA: 3  Anesthesia Plan: Spinal   Post-op Pain Management: Dilaudid IV and Ofirmev IV (intra-op)*   Induction: Intravenous  PONV Risk Score and Plan: 3 and Treatment may vary due to age or medical condition, Midazolam, Ondansetron, Dexamethasone and Propofol infusion  Airway Management Planned: Natural Airway, Nasal Cannula and Simple Face Mask  Additional Equipment: None  Intra-op Plan:   Post-operative Plan:   Informed Consent: I have reviewed the patients History and Physical, chart, labs and discussed the procedure including the risks, benefits and alternatives for the proposed anesthesia with the patient or authorized  representative who has indicated his/her understanding and acceptance.     Dental advisory given  Plan Discussed with: CRNA and Anesthesiologist  Anesthesia Plan Comments:          Anesthesia Quick Evaluation

## 2024-05-13 NOTE — H&P (Signed)
 Chief Complaint: RIGHT HIP PAIN  HPI: Caitlin Moore is a 63 y.o. female who presents for evaluation of RIGHT HIP PAIN. It has been present for several months and has been worsening. She has failed conservative measures. Pain is rated as severe.Xrays of the right hip show collapse of the femoral head consistant with femoral head fracture.  No relief with conservative management.  Past Medical History:  Diagnosis Date   Anxiety    Arthritis    Carpal tunnel syndrome    Chronic pain disorder    COPD (chronic obstructive pulmonary disease) (HCC)    Diabetes mellitus without complication (HCC)    GERD (gastroesophageal reflux disease)    Headache    migraines   Hypertension    Past Surgical History:  Procedure Laterality Date   APPENDECTOMY  1975   BACK SURGERY     CATARACT EXTRACTION Bilateral    cyst removed     from uterus   RIB RESECTION Left    and shoulder surgery   Social History   Socioeconomic History   Marital status: Single    Spouse name: Not on file   Number of children: 0   Years of education: 12   Highest education level: Not on file  Occupational History    Comment: Disabled  Tobacco Use   Smoking status: Former   Smokeless tobacco: Never   Tobacco comments:    Quit 2009  Vaping Use   Vaping status: Never Used  Substance and Sexual Activity   Alcohol use: No    Alcohol/week: 0.0 standard drinks of alcohol   Drug use: Yes    Frequency: 3.0 times per week    Types: Marijuana   Sexual activity: Not on file  Other Topics Concern   Not on file  Social History Narrative   Patient is single    Disabled   Right handed   High school education   Caffeine eight cups daily          Social Drivers of Health   Financial Resource Strain: Low Risk  (01/09/2024)   Received from [facility]   Overall Financial Resource Strain (CARDIA)    Difficulty of Paying Living Expenses: Not very hard  Food Insecurity: No Food Insecurity (01/09/2024)   Received  from [facility]   Hunger Vital Sign    Within the past 12 months, you worried that your food would run out before you got the money to buy more.: Never true    Within the past 12 months, the food you bought just didn't last and you didn't have money to get more.: Never true  Transportation Needs: No Transportation Needs (01/09/2024)   Received from [facility] - Transportation    Lack of Transportation (Medical): No    Lack of Transportation (Non-Medical): No  Physical Activity: Unknown (01/09/2024)   Received from [facility]   Exercise Vital Sign    On average, how many days per week do you engage in moderate to strenuous exercise (like a brisk walk)?: 0 days    Minutes of Exercise per Session: Not on file  Stress: Stress Concern Present (01/09/2024)   Received from [facility] - Occupational Stress Questionnaire    Feeling of Stress : To some extent  Social Connections: Moderately Integrated (01/09/2024)   Received from [facility]   Social Network    How would you rate your social network (family, work, friends)?: Adequate participation  with social networks   Family History  Problem Relation Age of Onset   Stroke Other    Migraines Other    Brain cancer Father    Lung cancer Mother    Allergies  Allergen Reactions   Atenolol Nausea And Vomiting    Bad migraines   Cymbalta [Duloxetine Hcl] Other (See Comments)    Shakes   Lyrica [Pregabalin] Other (See Comments)    Unknown   Methadone Other (See Comments)    Seizures   Paroxetine Nausea And Vomiting   Paxil [Paroxetine Hcl] Nausea And Vomiting   Propoxyphene N-Acetaminophen Nausea And Vomiting   Sertraline Hcl Nausea And Vomiting    migraine   Zoloft [Sertraline Hcl] Other (See Comments)    Migraine   Prior to Admission medications   Medication Sig Start Date End Date Taking? Authorizing Provider  amLODipine (NORVASC) 10 MG tablet Take 10 mg by mouth in  the morning. 01/11/24  Yes [provider]  amphetamine-dextroamphetamine (ADDERALL) 15 MG tablet Take 15 mg by mouth 3 (three) times daily.   Yes [provider]  Ascorbic Acid (VITAMIN C) 1000 MG tablet Take 1,000 mg by mouth daily.   Yes [provider]  celecoxib (CELEBREX) 200 MG capsule 200 mg 2 (two) times daily. 04/09/24  Yes [provider]  dexlansoprazole (DEXILANT) 60 MG capsule Take 60 mg by mouth at bedtime. 08/15/23  Yes [provider]  diazepam (VALIUM) 10 MG tablet Take 10 mg by mouth 3 (three) times daily. 04/17/24  Yes [provider]  esomeprazole (NEXIUM) 40 MG capsule Take 40 mg by mouth 2 (two) times daily. 11/29/23  Yes [provider]  hydrochlorothiazide (HYDRODIURIL) 50 MG tablet Take 50 mg by mouth in the morning. 01/11/24  Yes [provider]  HYDROcodone-acetaminophen (NORCO) 10-325 MG tablet Take 1 tablet by mouth every 4 (four) hours as needed for moderate pain (pain score 4-6) or severe pain (pain score 7-10).   Yes [provider]  irbesartan (AVAPRO) 300 MG tablet Take 300 mg by mouth daily. 12/15/23  Yes [provider]  levalbuterol (XOPENEX HFA) 45 MCG/ACT inhaler Inhale 2 puffs into the lungs every 4 (four) hours as needed for shortness of breath.   Yes [provider]  metoprolol succinate (TOPROL-XL) 25 MG 24 hr tablet Take 50 mg by mouth in the morning. 12/19/23  Yes [provider]  morphine (MS CONTIN) 30 MG 12 hr tablet Take 60 mg by mouth every 12 (twelve) hours. 01/06/15  Yes [provider]  Multiple Vitamins-Minerals (MULTIVITAMIN WITH MINERALS) tablet Take 1 tablet by mouth daily. Centrum Silver   Yes [provider]  naloxone (NARCAN) nasal spray 4 mg/0.1 mL CALL 911. SPR CONTENTS OF ONE SPRAYER (0.1ML) INTO ONE NOSTRIL. REPEAT IN 2-3 MIN IF SYMPTOMS OF OPIOID EMERGENCY PERSIST, ALTERNATE NOSTRILS 04/23/21  Yes [provider]   promethazine (PHENERGAN) 12.5 MG tablet Take 12.5 mg by mouth every 6 (six) hours as needed for nausea or vomiting. 03/22/24  Yes [provider]  rizatriptan (MAXALT) 10 MG tablet Take 10 mg by mouth as needed for migraine. May repeat in 2 hours if needed   Yes [provider]  rosuvastatin (CRESTOR) 20 MG tablet Take 20 mg by mouth at bedtime. 01/11/24  Yes [provider]  RYBELSUS 3 MG TABS Take 3 mg by mouth daily. 09/29/23  Yes [provider]  hydrOXYzine (ATARAX) 25 MG tablet Take 25 mg by mouth every 8 (eight)  hours as needed for itching.    [provider]     Positive ROS: pos for obesity, substance abuse,arthralgias, occ SOB  All other systems have been reviewed and were otherwise negative with the exception of those mentioned in the HPI and as above.  Physical Exam: There were no vitals filed for this visit.  General: Alert, no acute distress Cardiovascular: No pedal edema Respiratory: No cyanosis, no use of accessory musculature GI: No organomegaly, abdomen is soft and non-tender Skin: No lesions in the area of chief complaint Neurologic: Sensation intact distally Psychiatric: Patient is competent for consent with normal mood and affect Lymphatic: No axillary or cervical lymphadenopathy  MUSCULOSKELETAL: Pt anbulates with severe antal;gic gait secondary to right hip pai.  Severe pain with rotation of hip which nis nseverely limited.  Assessment/Plan: RIGHT HIP FEMORAL HEAD FRACTURE Plan for Procedure(s): Right total hip replacement, DIRECT ANTERIOR APPROACH, FOR FRACTURE Pt aware of risks and benefits of surgery and c onsents to move forward with sugery.

## 2024-05-14 ENCOUNTER — Inpatient Hospital Stay (HOSPITAL_COMMUNITY)

## 2024-05-14 ENCOUNTER — Observation Stay (HOSPITAL_COMMUNITY)
Admission: RE | Admit: 2024-05-14 | Discharge: 2024-05-15 | Disposition: A | Discharge status: Home / Self Care | Source: Ambulatory Visit | Attending: Orthopedic Surgery | Admitting: Orthopedic Surgery

## 2024-05-14 ENCOUNTER — Other Ambulatory Visit: Payer: Self-pay

## 2024-05-14 ENCOUNTER — Encounter (HOSPITAL_COMMUNITY): Payer: Self-pay | Admitting: Orthopedic Surgery

## 2024-05-14 ENCOUNTER — Inpatient Hospital Stay (HOSPITAL_COMMUNITY): Admitting: Anesthesiology

## 2024-05-14 ENCOUNTER — Encounter (HOSPITAL_COMMUNITY)
Admission: RE | Disposition: A | Discharge status: Home / Self Care | Payer: Self-pay | Source: Ambulatory Visit | Attending: Orthopedic Surgery

## 2024-05-14 ENCOUNTER — Inpatient Hospital Stay (HOSPITAL_COMMUNITY): Payer: Self-pay | Admitting: Physician Assistant

## 2024-05-14 DIAGNOSIS — X58XXXA Exposure to other specified factors, initial encounter: Secondary | ICD-10-CM | POA: Diagnosis not present

## 2024-05-14 DIAGNOSIS — S72061A Displaced articular fracture of head of right femur, initial encounter for closed fracture: Secondary | ICD-10-CM | POA: Diagnosis not present

## 2024-05-14 DIAGNOSIS — I10 Essential (primary) hypertension: Secondary | ICD-10-CM | POA: Insufficient documentation

## 2024-05-14 DIAGNOSIS — J449 Chronic obstructive pulmonary disease, unspecified: Secondary | ICD-10-CM | POA: Diagnosis not present

## 2024-05-14 DIAGNOSIS — Z79899 Other long term (current) drug therapy: Secondary | ICD-10-CM | POA: Insufficient documentation

## 2024-05-14 DIAGNOSIS — M25551 Pain in right hip: Secondary | ICD-10-CM | POA: Diagnosis present

## 2024-05-14 DIAGNOSIS — Z87891 Personal history of nicotine dependence: Secondary | ICD-10-CM | POA: Diagnosis not present

## 2024-05-14 DIAGNOSIS — S72051A Unspecified fracture of head of right femur, initial encounter for closed fracture: Secondary | ICD-10-CM

## 2024-05-14 DIAGNOSIS — E119 Type 2 diabetes mellitus without complications: Secondary | ICD-10-CM | POA: Insufficient documentation

## 2024-05-14 DIAGNOSIS — E118 Type 2 diabetes mellitus with unspecified complications: Secondary | ICD-10-CM

## 2024-05-14 DIAGNOSIS — S72059A Unspecified fracture of head of unspecified femur, initial encounter for closed fracture: Principal | ICD-10-CM | POA: Diagnosis present

## 2024-05-14 DIAGNOSIS — S72051P Unspecified fracture of head of right femur, subsequent encounter for closed fracture with malunion: Principal | ICD-10-CM

## 2024-05-14 HISTORY — PX: TOTAL HIP ARTHROPLASTY: SHX124

## 2024-05-14 LAB — GLUCOSE, CAPILLARY
Glucose-Capillary: 127 mg/dL — ABNORMAL HIGH (ref 70–99)
Glucose-Capillary: 144 mg/dL — ABNORMAL HIGH (ref 70–99)
Glucose-Capillary: 151 mg/dL — ABNORMAL HIGH (ref 70–99)

## 2024-05-14 LAB — TYPE AND SCREEN
ABO/RH(D): A NEG
Antibody Screen: NEGATIVE

## 2024-05-14 LAB — ABO/RH: ABO/RH(D): A NEG

## 2024-05-14 SURGERY — ARTHROPLASTY, HIP, TOTAL, ANTERIOR APPROACH
Anesthesia: Spinal | Laterality: Right

## 2024-05-14 MED ORDER — DIPHENHYDRAMINE HCL 12.5 MG/5ML PO ELIX: 12.5000 mg | ORAL_SOLUTION | ORAL | Status: DC | PRN

## 2024-05-14 MED ORDER — CELECOXIB 200 MG PO CAPS
200.0000 mg | ORAL_CAPSULE | Freq: Two times a day (BID) | ORAL | Status: DC
  Administered 2024-05-14 – 2024-05-15 (×2): 200 mg via ORAL
  Filled 2024-05-14 (×2): qty 1

## 2024-05-14 MED ORDER — MIDAZOLAM HCL (PF) 2 MG/2ML IJ SOLN
INTRAMUSCULAR | Status: DC | PRN
  Administered 2024-05-14: 2 mg via INTRAVENOUS

## 2024-05-14 MED ORDER — ONDANSETRON HCL 4 MG/2ML IJ SOLN
INTRAMUSCULAR | Status: DC | PRN
Start: 2024-05-14 — End: 2024-05-14
  Administered 2024-05-14: 4 mg via INTRAVENOUS

## 2024-05-14 MED ORDER — TRANEXAMIC ACID-NACL 1000-0.7 MG/100ML-% IV SOLN
1000.0000 mg | Freq: Once | INTRAVENOUS | Status: AC
  Administered 2024-05-14: 1000 mg via INTRAVENOUS
  Filled 2024-05-14: qty 100

## 2024-05-14 MED ORDER — PROPOFOL 500 MG/50ML IV EMUL
INTRAVENOUS | Status: DC | PRN
Start: 2024-05-14 — End: 2024-05-14
  Administered 2024-05-14: 85 ug/kg/min via INTRAVENOUS

## 2024-05-14 MED ORDER — PHENYLEPHRINE HCL (PRESSORS) 10 MG/ML IV SOLN
INTRAVENOUS | Status: AC
  Filled 2024-05-14: qty 1

## 2024-05-14 MED ORDER — ORAL CARE MOUTH RINSE: 15.0000 mL | Freq: Once | OROMUCOSAL | Status: AC

## 2024-05-14 MED ORDER — MORPHINE SULFATE ER 30 MG PO TBCR
60.0000 mg | EXTENDED_RELEASE_TABLET | Freq: Two times a day (BID) | ORAL | Status: DC
  Administered 2024-05-14 – 2024-05-15 (×2): 60 mg via ORAL
  Filled 2024-05-14 (×2): qty 2

## 2024-05-14 MED ORDER — HYDROMORPHONE HCL 1 MG/ML IJ SOLN
0.2500 mg | INTRAMUSCULAR | Status: DC | PRN
  Administered 2024-05-14 (×2): 0.5 mg via INTRAVENOUS

## 2024-05-14 MED ORDER — HYDROMORPHONE HCL 2 MG PO TABS
ORAL_TABLET | ORAL | Status: AC
  Filled 2024-05-14: qty 1

## 2024-05-14 MED ORDER — ALUM & MAG HYDROXIDE-SIMETH 200-200-20 MG/5ML PO SUSP: 30.0000 mL | ORAL | Status: DC | PRN

## 2024-05-14 MED ORDER — POLYETHYLENE GLYCOL 3350 17 G PO PACK: 17.0000 g | PACK | Freq: Every day | ORAL | Status: DC | PRN

## 2024-05-14 MED ORDER — CEFAZOLIN SODIUM-DEXTROSE 2-4 GM/100ML-% IV SOLN
2.0000 g | Freq: Four times a day (QID) | INTRAVENOUS | Status: AC
  Administered 2024-05-14 – 2024-05-15 (×2): 2 g via INTRAVENOUS
  Filled 2024-05-14 (×2): qty 100

## 2024-05-14 MED ORDER — DIAZEPAM 5 MG PO TABS
10.0000 mg | ORAL_TABLET | Freq: Three times a day (TID) | ORAL | Status: DC
  Administered 2024-05-14 – 2024-05-15 (×2): 10 mg via ORAL
  Filled 2024-05-14 (×2): qty 2

## 2024-05-14 MED ORDER — APIXABAN 2.5 MG PO TABS
2.5000 mg | ORAL_TABLET | Freq: Two times a day (BID) | ORAL | Status: DC
  Administered 2024-05-15: 2.5 mg via ORAL
  Filled 2024-05-14: qty 1

## 2024-05-14 MED ORDER — STERILE WATER FOR IRRIGATION IR SOLN
Status: DC | PRN
  Administered 2024-05-14: 2000 mL

## 2024-05-14 MED ORDER — ONDANSETRON HCL 4 MG/2ML IJ SOLN: 4.0000 mg | Freq: Four times a day (QID) | INTRAMUSCULAR | Status: DC | PRN

## 2024-05-14 MED ORDER — LEVALBUTEROL TARTRATE 45 MCG/ACT IN AERO: 2.0000 | INHALATION_SPRAY | RESPIRATORY_TRACT | Status: DC | PRN

## 2024-05-14 MED ORDER — DEXMEDETOMIDINE HCL IN NACL 80 MCG/20ML IV SOLN
INTRAVENOUS | Status: DC | PRN
  Administered 2024-05-14: 12 ug via INTRAVENOUS

## 2024-05-14 MED ORDER — SEMAGLUTIDE 3 MG PO TABS: 3.0000 mg | ORAL_TABLET | Freq: Every day | ORAL | Status: DC

## 2024-05-14 MED ORDER — DEXAMETHASONE SOD PHOSPHATE PF 10 MG/ML IJ SOLN
10.0000 mg | Freq: Two times a day (BID) | INTRAMUSCULAR | Status: AC
  Administered 2024-05-14 – 2024-05-15 (×2): 10 mg via INTRAVENOUS

## 2024-05-14 MED ORDER — PROPOFOL 10 MG/ML IV BOLUS
INTRAVENOUS | Status: DC | PRN
  Administered 2024-05-14: 50 mg via INTRAVENOUS

## 2024-05-14 MED ORDER — HYDROCHLOROTHIAZIDE 25 MG PO TABS
50.0000 mg | ORAL_TABLET | Freq: Every day | ORAL | Status: DC
  Administered 2024-05-15: 50 mg via ORAL
  Filled 2024-05-14: qty 2

## 2024-05-14 MED ORDER — POVIDONE-IODINE 10 % EX SWAB: 2.0000 | Freq: Once | CUTANEOUS | Status: DC

## 2024-05-14 MED ORDER — HYDROMORPHONE HCL 1 MG/ML IJ SOLN
1.0000 mg | INTRAMUSCULAR | Status: DC | PRN
  Administered 2024-05-14: 1 mg via INTRAVENOUS
  Filled 2024-05-14: qty 1

## 2024-05-14 MED ORDER — MIDAZOLAM HCL 2 MG/2ML IJ SOLN
INTRAMUSCULAR | Status: AC
  Filled 2024-05-14: qty 2

## 2024-05-14 MED ORDER — DEXAMETHASONE SOD PHOSPHATE PF 10 MG/ML IJ SOLN
INTRAMUSCULAR | Status: DC | PRN
  Administered 2024-05-14: 10 mg via INTRAVENOUS

## 2024-05-14 MED ORDER — INSULIN ASPART 100 UNIT/ML IJ SOLN: 0.0000 [IU] | INTRAMUSCULAR | Status: DC | PRN

## 2024-05-14 MED ORDER — METHOCARBAMOL 1000 MG/10ML IJ SOLN: 500.0000 mg | Freq: Four times a day (QID) | INTRAMUSCULAR | Status: DC | PRN

## 2024-05-14 MED ORDER — METHOCARBAMOL 500 MG PO TABS
500.0000 mg | ORAL_TABLET | Freq: Four times a day (QID) | ORAL | Status: DC | PRN
  Administered 2024-05-14 – 2024-05-15 (×2): 500 mg via ORAL
  Filled 2024-05-14: qty 1

## 2024-05-14 MED ORDER — 0.9 % SODIUM CHLORIDE (POUR BTL) OPTIME
TOPICAL | Status: DC | PRN
  Administered 2024-05-14: 1000 mL

## 2024-05-14 MED ORDER — PROMETHAZINE HCL 25 MG PO TABS: 12.5000 mg | ORAL_TABLET | Freq: Four times a day (QID) | ORAL | Status: DC | PRN

## 2024-05-14 MED ORDER — IRBESARTAN 150 MG PO TABS
300.0000 mg | ORAL_TABLET | Freq: Every day | ORAL | Status: DC
  Filled 2024-05-14: qty 2

## 2024-05-14 MED ORDER — METOPROLOL SUCCINATE ER 50 MG PO TB24
50.0000 mg | ORAL_TABLET | Freq: Every day | ORAL | Status: DC
Start: 2024-05-15 — End: 2024-05-15
  Administered 2024-05-15: 50 mg via ORAL
  Filled 2024-05-14: qty 1

## 2024-05-14 MED ORDER — SODIUM CHLORIDE 0.9 % IV SOLN
INTRAVENOUS | Status: DC
Start: 2024-05-14 — End: 2024-05-15

## 2024-05-14 MED ORDER — BUPIVACAINE LIPOSOME 1.3 % IJ SUSP
INTRAMUSCULAR | Status: AC
  Filled 2024-05-14: qty 10

## 2024-05-14 MED ORDER — ALBUTEROL SULFATE (2.5 MG/3ML) 0.083% IN NEBU: 2.5000 mg | INHALATION_SOLUTION | RESPIRATORY_TRACT | Status: DC | PRN

## 2024-05-14 MED ORDER — OXYCODONE HCL 5 MG/5ML PO SOLN: 5.0000 mg | Freq: Once | ORAL | Status: DC | PRN

## 2024-05-14 MED ORDER — DROPERIDOL 2.5 MG/ML IJ SOLN
0.6250 mg | Freq: Once | INTRAMUSCULAR | Status: DC | PRN
Start: 2024-05-14 — End: 2024-05-14

## 2024-05-14 MED ORDER — ACETAMINOPHEN 500 MG PO TABS
1000.0000 mg | ORAL_TABLET | Freq: Four times a day (QID) | ORAL | Status: DC
  Administered 2024-05-14 – 2024-05-15 (×3): 1000 mg via ORAL
  Filled 2024-05-14 (×3): qty 2

## 2024-05-14 MED ORDER — APIXABAN 2.5 MG PO TABS: 2.5000 mg | ORAL_TABLET | Freq: Two times a day (BID) | ORAL | 0 refills | Status: AC

## 2024-05-14 MED ORDER — HYDROMORPHONE HCL 2 MG PO TABS: 2.0000 mg | ORAL_TABLET | ORAL | 0 refills | Status: AC | PRN

## 2024-05-14 MED ORDER — ONDANSETRON HCL 4 MG/2ML IJ SOLN
INTRAMUSCULAR | Status: AC
  Filled 2024-05-14: qty 2

## 2024-05-14 MED ORDER — MAGNESIUM CITRATE PO SOLN: 1.0000 | Freq: Once | ORAL | Status: DC | PRN

## 2024-05-14 MED ORDER — CEFAZOLIN SODIUM-DEXTROSE 2-4 GM/100ML-% IV SOLN
2.0000 g | INTRAVENOUS | Status: AC
Start: 2024-05-14 — End: 2024-05-14
  Administered 2024-05-14: 2 g via INTRAVENOUS
  Filled 2024-05-14: qty 100

## 2024-05-14 MED ORDER — HYDROMORPHONE HCL 2 MG PO TABS
2.0000 mg | ORAL_TABLET | ORAL | Status: DC | PRN
  Administered 2024-05-14 – 2024-05-15 (×3): 2 mg via ORAL
  Filled 2024-05-14 (×2): qty 1

## 2024-05-14 MED ORDER — TRANEXAMIC ACID-NACL 1000-0.7 MG/100ML-% IV SOLN
1000.0000 mg | INTRAVENOUS | Status: AC
  Administered 2024-05-14: 1000 mg via INTRAVENOUS
  Filled 2024-05-14: qty 100

## 2024-05-14 MED ORDER — DOCUSATE SODIUM 100 MG PO CAPS: 100.0000 mg | ORAL_CAPSULE | Freq: Two times a day (BID) | ORAL | 0 refills | Status: AC

## 2024-05-14 MED ORDER — OXYCODONE HCL 5 MG PO TABS: 5.0000 mg | ORAL_TABLET | Freq: Once | ORAL | Status: DC | PRN

## 2024-05-14 MED ORDER — PHENYLEPHRINE 80 MCG/ML (10ML) SYRINGE FOR IV PUSH (FOR BLOOD PRESSURE SUPPORT)
PREFILLED_SYRINGE | INTRAVENOUS | Status: AC
  Filled 2024-05-14: qty 10

## 2024-05-14 MED ORDER — DOCUSATE SODIUM 100 MG PO CAPS
100.0000 mg | ORAL_CAPSULE | Freq: Two times a day (BID) | ORAL | Status: DC
  Administered 2024-05-14 – 2024-05-15 (×2): 100 mg via ORAL
  Filled 2024-05-14 (×2): qty 1

## 2024-05-14 MED ORDER — TIZANIDINE HCL 4 MG PO TABS: 4.0000 mg | ORAL_TABLET | Freq: Three times a day (TID) | ORAL | 1 refills | Status: AC | PRN

## 2024-05-14 MED ORDER — CEFADROXIL 500 MG PO CAPS: 500.0000 mg | ORAL_CAPSULE | Freq: Two times a day (BID) | ORAL | 0 refills | Status: AC

## 2024-05-14 MED ORDER — LACTATED RINGERS IV SOLN: INTRAVENOUS | Status: DC

## 2024-05-14 MED ORDER — BISACODYL 5 MG PO TBEC: 5.0000 mg | DELAYED_RELEASE_TABLET | Freq: Every day | ORAL | Status: DC | PRN

## 2024-05-14 MED ORDER — PHENYLEPHRINE HCL-NACL 20-0.9 MG/250ML-% IV SOLN
INTRAVENOUS | Status: DC | PRN
  Administered 2024-05-14: 30 ug/min via INTRAVENOUS

## 2024-05-14 MED ORDER — PHENYLEPHRINE 80 MCG/ML (10ML) SYRINGE FOR IV PUSH (FOR BLOOD PRESSURE SUPPORT)
PREFILLED_SYRINGE | INTRAVENOUS | Status: DC | PRN
  Administered 2024-05-14: 160 ug via INTRAVENOUS

## 2024-05-14 MED ORDER — ONDANSETRON HCL 4 MG PO TABS: 4.0000 mg | ORAL_TABLET | Freq: Four times a day (QID) | ORAL | Status: DC | PRN

## 2024-05-14 MED ORDER — PANTOPRAZOLE SODIUM 40 MG PO TBEC
80.0000 mg | DELAYED_RELEASE_TABLET | Freq: Every day | ORAL | Status: DC
  Administered 2024-05-14: 80 mg via ORAL
  Filled 2024-05-14: qty 2

## 2024-05-14 MED ORDER — HYDROMORPHONE HCL 1 MG/ML IJ SOLN
INTRAMUSCULAR | Status: AC
  Filled 2024-05-14: qty 1

## 2024-05-14 MED ORDER — AMPHETAMINE-DEXTROAMPHETAMINE 15 MG PO TABS: 15.0000 mg | ORAL_TABLET | Freq: Three times a day (TID) | ORAL | Status: DC

## 2024-05-14 MED ORDER — CHLORHEXIDINE GLUCONATE 0.12 % MT SOLN
15.0000 mL | Freq: Once | OROMUCOSAL | Status: AC
  Administered 2024-05-14: 15 mL via OROMUCOSAL

## 2024-05-14 MED ORDER — ONDANSETRON HCL 4 MG/2ML IJ SOLN: 4.0000 mg | Freq: Once | INTRAMUSCULAR | Status: DC | PRN

## 2024-05-14 MED ORDER — BUPIVACAINE-EPINEPHRINE (PF) 0.25% -1:200000 IJ SOLN
INTRAMUSCULAR | Status: DC | PRN
  Administered 2024-05-14: 40 mL

## 2024-05-14 MED ORDER — AMLODIPINE BESYLATE 10 MG PO TABS
10.0000 mg | ORAL_TABLET | Freq: Every day | ORAL | Status: DC
Start: 2024-05-15 — End: 2024-05-15
  Administered 2024-05-15: 10 mg via ORAL
  Filled 2024-05-14: qty 1

## 2024-05-14 MED ORDER — BUPIVACAINE-EPINEPHRINE (PF) 0.25% -1:200000 IJ SOLN
INTRAMUSCULAR | Status: AC
  Filled 2024-05-14: qty 30

## 2024-05-14 MED ORDER — BUPIVACAINE IN DEXTROSE 0.75-8.25 % IT SOLN
INTRATHECAL | Status: DC | PRN
Start: 2024-05-14 — End: 2024-05-14
  Administered 2024-05-14: 1.8 mL via INTRATHECAL

## 2024-05-14 MED ORDER — METHOCARBAMOL 500 MG PO TABS
ORAL_TABLET | ORAL | Status: AC
  Filled 2024-05-14: qty 1

## 2024-05-14 MED ORDER — BUPIVACAINE LIPOSOME 1.3 % IJ SUSP: 10.0000 mL | Freq: Once | INTRAMUSCULAR | Status: DC

## 2024-05-14 SURGICAL SUPPLY — 44 items
BAG COUNTER SPONGE SURGICOUNT (BAG) IMPLANT
BAG ZIPLOCK 12X15 (MISCELLANEOUS) IMPLANT
BENZOIN TINCTURE PRP APPL 2/3 (GAUZE/BANDAGES/DRESSINGS) IMPLANT
BLADE SAW SGTL 18X1.27X75 (BLADE) ×1 IMPLANT
COVER PERINEAL POST (MISCELLANEOUS) ×1 IMPLANT
COVER SURGICAL LIGHT HANDLE (MISCELLANEOUS) ×1 IMPLANT
CUP ACETBLR 52 OD 100 SERIES (Hips) IMPLANT
DRAPE FOOT SWITCH (DRAPES) ×1 IMPLANT
DRAPE STERI IOBAN 125X83 (DRAPES) ×1 IMPLANT
DRAPE U-SHAPE 47X51 STRL (DRAPES) ×2 IMPLANT
DRSG AQUACEL AG ADV 3.5X 6 (GAUZE/BANDAGES/DRESSINGS) ×1 IMPLANT
DRSG AQUACEL AG ADV 3.5X10 (GAUZE/BANDAGES/DRESSINGS) IMPLANT
DURAPREP 26ML APPLICATOR (WOUND CARE) ×1 IMPLANT
ELECT PENCIL ROCKER SW 15FT (MISCELLANEOUS) ×1 IMPLANT
ELECT REM PT RETURN 15FT ADLT (MISCELLANEOUS) ×1 IMPLANT
ELIMINATOR HOLE APEX DEPUY (Hips) IMPLANT
FEMORAL STEM 12/14 TPR SZ4 HIP (Orthopedic Implant) IMPLANT
GAUZE XEROFORM 1X8 LF (GAUZE/BANDAGES/DRESSINGS) IMPLANT
GLOVE BIOGEL PI IND STRL 8 (GLOVE) ×2 IMPLANT
GLOVE BIOGEL PI IND STRL 9 (GLOVE) IMPLANT
GLOVE ECLIPSE 7.5 STRL STRAW (GLOVE) ×2 IMPLANT
GLOVE ECLIPSE 8.5 STRL (GLOVE) IMPLANT
GOWN STRL REUS W/ TWL XL LVL3 (GOWN DISPOSABLE) ×2 IMPLANT
HEAD CERAMIC DELTA 36 PLUS 1.5 (Hips) IMPLANT
HOLDER FOLEY CATH W/STRAP (MISCELLANEOUS) ×1 IMPLANT
HOOD PEEL AWAY T7 (MISCELLANEOUS) ×3 IMPLANT
KIT TURNOVER KIT A (KITS) ×1 IMPLANT
LINER ACETAB NEUTRAL 36ID 520D (Liner) IMPLANT
NDL HYPO 22X1.5 SAFETY MO (MISCELLANEOUS) ×2 IMPLANT
NEEDLE HYPO 22X1.5 SAFETY MO (MISCELLANEOUS) ×2 IMPLANT
PACK ANTERIOR HIP CUSTOM (KITS) ×1 IMPLANT
SPIKE FLUID TRANSFER (MISCELLANEOUS) ×1 IMPLANT
STAPLER SKIN PROX 35W (STAPLE) IMPLANT
STRIP CLOSURE SKIN 1/2X4 (GAUZE/BANDAGES/DRESSINGS) IMPLANT
SUT ETHIBOND NAB CT1 #1 30IN (SUTURE) ×2 IMPLANT
SUT MNCRL AB 3-0 PS2 18 (SUTURE) IMPLANT
SUT VIC AB 0 CT1 36 (SUTURE) ×1 IMPLANT
SUT VIC AB 1 CT1 36 (SUTURE) ×1 IMPLANT
SUT VIC AB 2-0 CT1 TAPERPNT 27 (SUTURE) ×1 IMPLANT
SUT VICRYL+ 3-0 36IN CT-1 (SUTURE) IMPLANT
TRAY CATH INTERMITTENT SS 16FR (CATHETERS) IMPLANT
TRAY FOLEY MTR SLVR 14FR STAT (SET/KITS/TRAYS/PACK) IMPLANT
TRAY FOLEY MTR SLVR 16FR STAT (SET/KITS/TRAYS/PACK) IMPLANT
TUBE SUCTION HIGH CAP CLEAR NV (SUCTIONS) ×1 IMPLANT

## 2024-05-14 NOTE — Interval H&P Note (Signed)
 History and Physical Interval Note:  05/14/2024 10:12 AM  Caitlin Moore  has presented today for surgery, with the diagnosis of RIGHT HIP FEMORAL HEAD FRACTURE.  The various methods of treatment have been discussed with the patient and family. After consideration of risks, benefits and other options for treatment, the patient has consented to  Procedure(s): HEMIARTHROPLASTY, HIP, DIRECT ANTERIOR APPROACH, FOR FRACTURE (Right) as a surgical intervention.  The patient's history has been reviewed, patient examined, no change in status, stable for surgery.  I have reviewed the patient's chart and labs.  Questions were answered to the patient's satisfaction.     Norleen LITTIE Gavel

## 2024-05-14 NOTE — Op Note (Signed)
 PATIENT ID:      Caitlin Moore  MRN:     [id number] DOB/AGE:    09/04/1960 / 63 y.o.       OPERATIVE REPORT    DATE OF PROCEDURE:  05/14/2024       PREOPERATIVE DIAGNOSIS:  RIGHT HIP FEMORAL HEAD FRACTURE with collapse                                                       Estimated body mass index is 44.13 kg/m as calculated from the following:   Height as of this encounter: 5' 3.5 (1.613 m).   Weight as of this encounter: 114.8 kg.     POSTOPERATIVE DIAGNOSIS:  RIGHT HIP FEMORAL HEAD FRACTURE                                                           PROCEDURE:  1. right total hip arthroplasty using a 52 mm DePuy Pinnacle  Cup, [employer],  neutral liner, a +1.5 36 mm ceramic head,  and a #4H  Actis stem, 2.interpretation of multiple intraoperative fluoroscopic images   SURGEON: Norleen LITTIE Gavel    ASSISTANT:   Signe Reining PA-C  (present throughout entire procedure and necessary for timely completion of the procedure)  ANESTHESIA: spinal  BLOOD LOSS: 350cc Tranexamic Acid: 1 gram IV DRAINS: None COMPLICATIONS: None    NDICATIONS FOR PROCEDURE:Patient with femoral head fracture with collapse of the right hip.  The patient is morbidly obese but under severe pain because of the collapse of the femoral head. X-rays show collapse of the femoral head and essentially fracture of the femoral head.SABRA Despite conservative measures with observation, anti-inflammatory medicine, narcotics, use of a cane, has severe unremitting pain and can ambulate only less than 1 block before resting.  Patient desires treatment to decrease pain and increase function.  We feel that total hip arthroplasty is the only reasonable course of action given the patient's level of activity and age.  The risks, benefits, and alternatives were discussed at length including but not limited to the risks of infection, bleeding, nerve injury, stiffness, blood clots, the need for revision surgery, cardiopulmonary  complications, among others, and they were willing to proceed.Benefits have been discussed. Questions answered.     PROCEDURE IN DETAIL: The patient was identified by armband,  received preoperative IV antibiotics in the holding area at [facility], taken to the operating room , appropriate anesthetic monitors  were attached and spinal anesthesia was induced.  The patient was placed onto the hot bed and all bony prominences were well-padded.The right hip was prepped and draped for an anterior approach to the hip.  An incision was made and the subcutaneous dissection was down to the level of the tensor fascia.  The fascia was opened and finger dissected.  The bleeders coming across the anterior portion of the hip were identified and cauterized. Retractors were put in place above and below the femoral neck.  The capsule was opened and tagged and a provisional neck cut was made.  The head was removed and sized on the back table.  The acetabulum was sequentially reamed to a level of 51 mm and a 52 mm porous-coated pinnacle cup was hammered into place with 45 of lateral opening and 30 of anteversion.fluoroscopy was used to ensure this position of the cup.  Attention was turned towards the femur where the leg was actually rotated, extended, and adduction did.  The femur was sequentially broached until a size of 4H broach gave a perfect fit and fill.at this point a  1.5 mm delta ceramic hip ball was placed and the hip reduced.  Fluoroscopic images were taken to assess the leg length, fit and fill of the stem, and cup position.  We were happy with the construct at this point.  The 4H broach was removed and a final Actis stem with standard offset  and a 1.5 mm ceramic hip ball was placed and reduced.  Final images were taken to make certain there were happy with the position at this point.   The capsule was closed with #1 Vicryl suture.  The tensor fascia was closed with 0 Vicryl suture.  The skin  was then closed with combination of 0 and 2-0 Vicryl suture.  The top layer was with 3-0 Monocryl suture.  Benzoin and Steri-Strips were applied  and a sterile compressive dressing was applied and the patient taken to recovery room she noted be in satisfactory condition.  Past medical Motion for the procedure was approximately 300 cc.  Of note Signe Reining was present the entire case and assisted by retraction of tissues, manipulation of the leg, and closing the minimize or time.  Norleen LITTIE Gavel 05/14/2024, 2:38 PM

## 2024-05-14 NOTE — Anesthesia Postprocedure Evaluation (Signed)
 Anesthesia Post Note  Patient: Caitlin Moore  Procedure(s) Performed: ARTHROPLASTY, HIP, TOTAL, ANTERIOR APPROACH (Right)     Patient location during evaluation: PACU Anesthesia Type: Spinal Level of consciousness: oriented and awake and alert Pain management: pain level controlled Vital Signs Assessment: post-procedure vital signs reviewed and stable Respiratory status: spontaneous breathing, respiratory function stable and nonlabored ventilation Cardiovascular status: blood pressure returned to baseline and stable Postop Assessment: no headache, no backache, no apparent nausea or vomiting, spinal receding and patient able to bend at knees Anesthetic complications: no   No notable events documented.  Last Vitals:  Vitals:   05/14/24 1500 05/14/24 1515  BP: (!) 110/47 113/74  Pulse: 68 64  Resp: 16 16  Temp:  (!) 36.2 C  SpO2: 97% 99%    Last Pain:  Vitals:   05/14/24 1515  TempSrc:   PainSc: 3                  Kristion Holifield A.

## 2024-05-14 NOTE — Transfer of Care (Signed)
 Immediate Anesthesia Transfer of Care Note  Patient: Caitlin Moore  Procedure(s) Performed: ARTHROPLASTY, HIP, TOTAL, ANTERIOR APPROACH (Right)  Patient Location: PACU  Anesthesia Type:Spinal  Level of Consciousness: awake and drowsy  Airway & Oxygen Therapy: Patient Spontanous Breathing and Patient connected to face mask oxygen  Post-op Assessment: Report given to RN and Post -op Vital signs reviewed and stable  Post vital signs: Reviewed and stable  Last Vitals:  Vitals Value Taken Time  BP 110/61 05/14/24 14:05  Temp    Pulse 64 05/14/24 14:06  Resp 17 05/14/24 14:06  SpO2 100 % 05/14/24 14:06  Vitals shown include unfiled device data.  Last Pain:  Vitals:   05/14/24 0907  TempSrc:   PainSc: 7       Patients Stated Pain Goal: 5 (05/14/24 0907)  Complications: No notable events documented.

## 2024-05-14 NOTE — Discharge Instructions (Addendum)
INSTRUCTIONS AFTER JOINT REPLACEMENT   Remove items at home which could result in a fall. This includes throw rugs or furniture in walking pathways ICE to the affected joint every three hours while awake for 30 minutes at a time, for at least the first 3-5 days, and then as needed for pain and swelling.  Continue to use ice for pain and swelling. You may notice swelling that will progress down to the foot and ankle.  This is normal after surgery.  Elevate your leg when you are not up walking on it.   Continue to use the breathing machine you got in the hospital (incentive spirometer) which will help keep your temperature down.  It is common for your temperature to cycle up and down following surgery, especially at night when you are not up moving around and exerting yourself.  The breathing machine keeps your lungs expanded and your temperature down.   DIET:  As you were doing prior to hospitalization, we recommend a well-balanced diet.  DRESSING / WOUND CARE / SHOWERING  Keep the surgical dressing until follow up.  The dressing is water proof, so you can shower without any extra covering.  IF THE DRESSING FALLS OFF or the wound gets wet inside, change the dressing with sterile gauze.  Please use good hand washing techniques before changing the dressing.  Do not use any lotions or creams on the incision until instructed by your surgeon.    ACTIVITY  Increase activity slowly as tolerated, but follow the weight bearing instructions below.   No driving for 6 weeks or until further direction given by your physician.  You cannot drive while taking narcotics.  No lifting or carrying greater than 10 lbs. until further directed by your surgeon. Avoid periods of inactivity such as sitting longer than an hour when not asleep. This helps prevent blood clots.  You may return to work once you are authorized by your doctor.     WEIGHT BEARING   Weight bearing as tolerated with assist device (walker, cane,  etc) as directed, use it as long as suggested by your surgeon or therapist, typically at least 4-6 weeks.   EXERCISES  Results after joint replacement surgery are often greatly improved when you follow the exercise, range of motion and muscle strengthening exercises prescribed by your doctor. Safety measures are also important to protect the joint from further injury. Any time any of these exercises cause you to have increased pain or swelling, decrease what you are doing until you are comfortable again and then slowly increase them. If you have problems or questions, call your caregiver or physical therapist for advice.   Rehabilitation is important following a joint replacement. After just a few days of immobilization, the muscles of the leg can become weakened and shrink (atrophy).  These exercises are designed to build up the tone and strength of the thigh and leg muscles and to improve motion. Often times heat used for twenty to thirty minutes before working out will loosen up your tissues and help with improving the range of motion but do not use heat for the first two weeks following surgery (sometimes heat can increase post-operative swelling).   These exercises can be done on a training (exercise) mat, on the floor, on a table or on a bed. Use whatever works the best and is most comfortable for you.    Use music or television while you are exercising so that the exercises are a pleasant break in your  day. This will make your life better with the exercises acting as a break in your routine that you can look forward to.   Perform all exercises about fifteen times, three times per day or as directed.  You should exercise both the operative leg and the other leg as well.  Exercises include:   Quad Sets - Tighten up the muscle on the front of the thigh (Quad) and hold for 5-10 seconds.   Straight Leg Raises - With your knee straight (if you were given a brace, keep it on), lift the leg to 60  degrees, hold for 3 seconds, and slowly lower the leg.  Perform this exercise against resistance later as your leg gets stronger.  Leg Slides: Lying on your back, slowly slide your foot toward your buttocks, bending your knee up off the floor (only go as far as is comfortable). Then slowly slide your foot back down until your leg is flat on the floor again.  Angel Wings: Lying on your back spread your legs to the side as far apart as you can without causing discomfort.  Hamstring Strength:  Lying on your back, push your heel against the floor with your leg straight by tightening up the muscles of your buttocks.  Repeat, but this time bend your knee to a comfortable angle, and push your heel against the floor.  You may put a pillow under the heel to make it more comfortable if necessary.   A rehabilitation program following joint replacement surgery can speed recovery and prevent re-injury in the future due to weakened muscles. Contact your doctor or a physical therapist for more information on knee rehabilitation.    CONSTIPATION  Constipation is defined medically as fewer than three stools per week and severe constipation as less than one stool per week.  Even if you have a regular bowel pattern at home, your normal regimen is likely to be disrupted due to multiple reasons following surgery.  Combination of anesthesia, postoperative narcotics, change in appetite and fluid intake all can affect your bowels.   YOU MUST use at least one of the following options; they are listed in order of increasing strength to get the job done.  They are all available over the counter, and you may need to use some, POSSIBLY even all of these options:    Drink plenty of fluids (prune juice may be helpful) and high fiber foods Colace 100 mg by mouth twice a day  Senokot for constipation as directed and as needed Dulcolax (bisacodyl), take with full glass of water  Miralax (polyethylene glycol) once or twice a day as  needed.  If you have tried all these things and are unable to have a bowel movement in the first 3-4 days after surgery call either your surgeon or your primary doctor.    If you experience loose stools or diarrhea, hold the medications until you stool forms back up.  If your symptoms do not get better within 1 week or if they get worse, check with your doctor.  If you experience "the worst abdominal pain ever" or develop nausea or vomiting, please contact the office immediately for further recommendations for treatment.   ITCHING:  If you experience itching with your medications, try taking only a single pain pill, or even half a pain pill at a time.  You can also use Benadryl over the counter for itching or also to help with sleep.   TED HOSE STOCKINGS:  Use stockings on both  legs until for at least 2 weeks or as directed by physician office. They may be removed at night for sleeping.  MEDICATIONS:  See your medication summary on the "After Visit Summary" that nursing will review with you.  You may have some home medications which will be placed on hold until you complete the course of blood thinner medication.  It is important for you to complete the blood thinner medication as prescribed.  PRECAUTIONS:  If you experience chest pain or shortness of breath - call 911 immediately for transfer to the hospital emergency department.   If you develop a fever greater that 101 F, purulent drainage from wound, increased redness or drainage from wound, foul odor from the wound/dressing, or calf pain - CONTACT YOUR SURGEON.                                                   FOLLOW-UP APPOINTMENTS:  If you do not already have a post-op appointment, please call the office for an appointment to be seen by your surgeon.  Guidelines for how soon to be seen are listed in your "After Visit Summary", but are typically between 1-4 weeks after surgery.  OTHER INSTRUCTIONS:   Knee Replacement:  Do not place pillow  under knee, focus on keeping the knee straight while resting. CPM instructions: 0-90 degrees, 2 hours in the morning, 2 hours in the afternoon, and 2 hours in the evening. Place foam block, curve side up under heel at all times except when in CPM or when walking.  DO NOT modify, tear, cut, or change the foam block in any way.  POST-OPERATIVE OPIOID TAPER INSTRUCTIONS: It is important to wean off of your opioid medication as soon as possible. If you do not need pain medication after your surgery it is ok to stop day one. Opioids include: Codeine, Hydrocodone(Norco, Vicodin), Oxycodone(Percocet, oxycontin) and hydromorphone amongst others.  Long term and even short term use of opiods can cause: Increased pain response Dependence Constipation Depression Respiratory depression And more.  Withdrawal symptoms can include Flu like symptoms Nausea, vomiting And more Techniques to manage these symptoms Hydrate well Eat regular healthy meals Stay active Use relaxation techniques(deep breathing, meditating, yoga) Do Not substitute Alcohol to help with tapering If you have been on opioids for less than two weeks and do not have pain than it is ok to stop all together.  Plan to wean off of opioids This plan should start within one week post op of your joint replacement. Maintain the same interval or time between taking each dose and first decrease the dose.  Cut the total daily intake of opioids by one tablet each day Next start to increase the time between doses. The last dose that should be eliminated is the evening dose.   MAKE SURE YOU:  Understand these instructions.  Get help right away if you are not doing well or get worse.    Thank you for letting us be a part of your medical care team.  It is a privilege we respect greatly.  We hope these instructions will help you stay on track for a fast and full recovery!   Information on my medicine - ELIQUIS (apixaban)   Why was Eliquis  prescribed for you? Eliquis was prescribed for you to reduce the risk of blood clots forming after orthopedic  surgery.    What do You need to know about Eliquis? Take your Eliquis TWICE DAILY - one tablet in the morning and one tablet in the evening with or without food.  It would be best to take the dose about the same time each day.  If you have difficulty swallowing the tablet whole please discuss with your pharmacist how to take the medication safely.  Take Eliquis exactly as prescribed by your doctor and DO NOT stop taking Eliquis without talking to the doctor who prescribed the medication.  Stopping without other medication to take the place of Eliquis may increase your risk of developing a clot.  After discharge, you should have regular check-up appointments with your healthcare provider that is prescribing your Eliquis.  What do you do if you miss a dose? If a dose of ELIQUIS is not taken at the scheduled time, take it as soon as possible on the same day and twice-daily administration should be resumed.  The dose should not be doubled to make up for a missed dose.  Do not take more than one tablet of ELIQUIS at the same time.  Important Safety Information A possible side effect of Eliquis is bleeding. You should call your healthcare provider right away if you experience any of the following: Bleeding from an injury or your nose that does not stop. Unusual colored urine (red or dark brown) or unusual colored stools (red or black). Unusual bruising for unknown reasons. A serious fall or if you hit your head (even if there is no bleeding).  Some medicines may interact with Eliquis and might increase your risk of bleeding or clotting while on Eliquis. To help avoid this, consult your healthcare provider or pharmacist prior to using any new prescription or non-prescription medications, including herbals, vitamins, non-steroidal anti-inflammatory drugs (NSAIDs) and  supplements.  This website has more information on Eliquis (apixaban): http://www.eliquis.com/eliquis/home

## 2024-05-14 NOTE — Anesthesia Procedure Notes (Addendum)
 Spinal  Patient location during procedure: OR Start time: 05/14/2024 11:26 AM End time: 05/14/2024 11:35 AM Reason for block: surgical anesthesia Staffing Performed: anesthesiologist  Anesthesiologist: Jerrye Sharper, MD Performed by: Jerrye Sharper, MD Authorized by: Jerrye Sharper, MD   Preanesthetic Checklist Completed: patient identified, IV checked, site marked, risks and benefits discussed, surgical consent, monitors and equipment checked, pre-op evaluation and timeout performed Spinal Block Patient position: sitting Prep: DuraPrep and site prepped and draped Patient monitoring: heart rate, cardiac monitor, continuous pulse ox and blood pressure Approach: midline Location: L3-4 Injection technique: single-shot Needle Needle type: Pencan  Needle gauge: 24 G Needle length: 9 cm Needle insertion depth: 8 cm Assessment Sensory level: T6 Events: CSF return and second provider Additional Notes Patient tolerated procedure well. Attempt x 4. Technically difficult due to patient position and MO. Adequate sensory level.

## 2024-05-15 ENCOUNTER — Encounter (HOSPITAL_COMMUNITY): Payer: Self-pay | Admitting: Orthopedic Surgery

## 2024-05-15 DIAGNOSIS — S72061A Displaced articular fracture of head of right femur, initial encounter for closed fracture: Secondary | ICD-10-CM | POA: Diagnosis not present

## 2024-05-15 NOTE — Plan of Care (Signed)
  Problem: Pain Management: Goal: Pain level will decrease with appropriate interventions Outcome: Progressing   Problem: Clinical Measurements: Goal: Postoperative complications will be avoided or minimized Outcome: Progressing   Problem: Activity: Goal: Ability to tolerate increased activity will improve Outcome: Progressing   Problem: Safety: Goal: Ability to remain free from injury will improve Outcome: Progressing   Problem: Coping: Goal: Level of anxiety will decrease Outcome: Progressing

## 2024-05-15 NOTE — Evaluation (Signed)
 Physical Therapy Evaluation Patient Details Name: Caitlin Moore MRN: [id number] DOB: [birth year] Today's Date: 05/15/2024  History of Present Illness  63 yo female S/p R DATHA due to Fracture of femoral head. EFY:rymnwpr pain, DM,COPD, back sg  Clinical Impression  Patient progressing well. Has met PT goals for DC to home with HHPT.        If plan is discharge home, recommend the following:  Assist with  ambulation, assist with bath, assist with steps   Can travel by private vehicle        Equipment Recommendations None recommended by PT  Recommendations for Other Services       Functional Status Assessment Patient has had a recent decline in their functional status and demonstrates the ability to make significant improvements in function in a reasonable and predictable amount of time.     Precautions / Restrictions Precautions Precautions: Fall Restrictions RLE Weight Bearing Per Provider Order: Weight bearing as tolerated      Mobility  Bed Mobility               General bed mobility comments: in recliner    Transfers Overall transfer level: Needs assistance Equipment used: Rolling walker (2 wheels) Transfers: Sit to/from Stand Sit to Stand: Contact guard assist           General transfer comment: cues for safety    Ambulation/Gait Ambulation/Gait assistance: Contact guard assist, Min assist Gait Distance (Feet): 80 Feet Assistive device: Rolling walker (2 wheels) Gait Pattern/deviations: Step-to pattern, Step-through pattern Gait velocity: decr     General Gait Details: cues for positiopn inside RW  Stairs            Wheelchair Mobility     Tilt Bed    Modified Rankin (Stroke Patients Only)       Balance Overall balance assessment: Mild deficits observed, not formally tested                                           Pertinent Vitals/Pain Pain Assessment Pain Assessment: 0-10 Pain Score: 6  Pain  Location: right hip Pain Descriptors / Indicators: Discomfort Pain Intervention(s): Premedicated before session, Monitored during session, Ice applied    Home Living Family/patient expects to be discharged to:: Private residence Living Arrangements: Non-relatives/Friends Available Help at Discharge: Family Type of Home: House Home Access: Stairs to enter Entrance Stairs-Rails: None Entrance Stairs-Number of Steps: 1/2 step x 2   Home Layout: Two level;1/2 bath on main level Home Equipment: Agricultural [employer]ant (2 wheels);Electric scooter      Prior Function Prior Level of Function : Needs assist             Mobility Comments: uses  RW or scooter ADLs Comments: sponge bathes     Extremity/Trunk Assessment   Upper Extremity Assessment Upper Extremity Assessment: Overall WFL for tasks assessed    Lower Extremity Assessment Lower Extremity Assessment: RLE deficits/detail RLE Deficits / Details: grossly 4/5    Cervical / Trunk Assessment Cervical / Trunk Assessment: Normal  Communication   Communication Communication: No apparent difficulties    Cognition Arousal: Alert Behavior During Therapy: WFL for tasks assessed/performed   PT - Cognitive impairments: No apparent impairments                         Following commands: Intact  Cueing       General Comments      Exercises Total Joint Exercises Ankle Circles/Pumps: AROM, Both, 10 reps Heel Slides: AAROM, Right, 5 reps   Assessment/Plan    PT Assessment All further PT needs can be met in the next venue of care (HHPT)  PT Problem List Decreased strength;Decreased range of motion;Decreased knowledge of use of DME;Decreased activity tolerance;Decreased mobility;Pain       PT Treatment Interventions      PT Goals (Current goals can be found in the Care Plan section)  Acute Rehab PT Goals Patient Stated Goal: home PT Goal Formulation: All assessment and education complete, DC therapy     Frequency       Co-evaluation               AM-PAC PT 6 Clicks Mobility  Outcome Measure Help needed turning from your back to your side while in a flat bed without using bedrails?: A Little Help needed moving from lying on your back to sitting on the side of a flat bed without using bedrails?: A Little Help needed moving to and from a bed to a chair (including a wheelchair)?: A Little Help needed standing up from a chair using your arms (e.g., wheelchair or bedside chair)?: A Little Help needed to walk in hospital room?: A Little Help needed climbing 3-5 steps with a railing? : A Little 6 Click Score: 18    End of Session Equipment Utilized During Treatment: Gait belt Activity Tolerance: Patient tolerated treatment well Patient left: in chair;with call bell/phone within reach;with chair alarm set Nurse Communication: Mobility status PT Visit Diagnosis: Unsteadiness on feet (R26.81)    Time: 8970-8942 PT Time Calculation (min) (ACUTE ONLY): 28 min   Charges:   PT Evaluation $PT Eval Low Complexity: 1 Low PT Treatments $Gait Training: 8-22 mins PT General Charges $$ ACUTE PT VISIT: 1 Visit         Caitlin Moore PT Acute Rehabilitation Services Office [phone]   Caitlin Moore 05/15/2024, 12:47 PM

## 2024-05-15 NOTE — TOC Transition Note (Signed)
 Transition of Care [facility]) - Discharge Note   Patient Details  Name: Caitlin Moore MRN: [id number] Date of Birth: [birth year]  [facility] CM/SW Contact:  NORMAN ASPEN, LCSW Phone Number: 05/15/2024, 10:54 AM   Clinical Narrative:     Met with pt who confirms she has needed DME in the home.  Orders in for HHPT follow up.  Pt aware, agreeable and no HH agency preference.  Have secured HHPT services with [facility].  No further IP CM needs.  Final next level of care: Home w Home Health Services Barriers to Discharge: No Barriers Identified   Patient Goals and CMS Choice Patient states their goals for this hospitalization and ongoing recovery are:: return home          Discharge Placement                       Discharge Plan and Services Additional resources added to the After Visit Summary for                  DME Arranged: N/A DME Agency: NA       HH Arranged: PT HH Agency: [facility] Date [facility] Agency Contacted: 05/15/24 Time HH Agency Contacted: [phone] Representative spoke with at [facility] Agency: [place]  Social Drivers of Health (SDOH) Interventions SDOH Screenings   Food Insecurity: No Food Insecurity (05/14/2024)  Housing: Unknown (05/14/2024)  Transportation Needs: No Transportation Needs (05/14/2024)  Utilities: Not At Risk (05/14/2024)  Financial Resource Strain: Low Risk  (01/09/2024)   Received from [facility]  Physical Activity: Unknown (01/09/2024)   Received from [facility]  Social Connections: Moderately Integrated (01/09/2024)   Received from [facility]  Stress: Stress Concern Present (01/09/2024)   Received from [facility]  Tobacco Use: Medium Risk (05/14/2024)     Readmission Risk Interventions    05/15/2024   10:41 AM  Readmission Risk Prevention Plan  Post Dischage Appt Complete  Medication Screening Complete  Transportation Screening Complete

## 2024-05-15 NOTE — Discharge Summary (Signed)
 Patient ID: Caitlin Moore MRN: [id number] DOB/AGE: Sep 14, 1960 63 y.o.  Admit date: 05/14/2024 Discharge date: 05/15/2024  Admission Diagnoses:  Principal Problem:   Fracture of femoral head (HCC) Active Problems:   Fracture of head of right femur [facility])   Discharge Diagnoses:  Same  Past Medical History:  Diagnosis Date   Anxiety    Arthritis    Carpal tunnel syndrome    Chronic pain disorder    COPD (chronic obstructive pulmonary disease) (HCC)    Diabetes mellitus without complication (HCC)    GERD (gastroesophageal reflux disease)    Headache    migraines   Hypertension     Surgeries: Procedure(s): Right ARTHROPLASTY, HIP, TOTAL, ANTERIOR APPROACH on 05/14/2024   Consultants:   Discharged Condition: Improved  Hospital Course: Caitlin Moore is an 63 y.o. female who was admitted 05/14/2024 for operative treatment ofFracture of femoral head (HCC). Patient has severe unremitting pain that affects sleep, daily activities, and work/hobbies. After pre-op clearance the patient was taken to the operating room on 05/14/2024 and underwent  Procedure(s): Right ARTHROPLASTY, HIP, TOTAL, ANTERIOR APPROACH.    Patient was given perioperative antibiotics:  Anti-infectives (From admission, onward)    Start     Dose/Rate Route Frequency Ordered Stop   05/14/24 1800  ceFAZolin (ANCEF) IVPB 2g/100 mL premix        2 g 200 mL/hr over 30 Minutes Intravenous Every 6 hours 05/14/24 1733 05/15/24 0104   05/14/24 0845  ceFAZolin (ANCEF) IVPB 2g/100 mL premix        2 g 200 mL/hr over 30 Minutes Intravenous On call to O.R. 05/14/24 0839 05/14/24 1137   05/14/24 0000  cefadroxil (DURICEF) 500 MG capsule        500 mg Oral 2 times daily 05/14/24 1307          Patient was given sequential compression devices, early ambulation, and chemoprophylaxis to prevent DVT.  Inpatient Morphine Milligram Equivalents Per Day 10/27 - 10/28   Values displayed are in units of MME/Day    Order  Start / End Date Yesterday Today    oxyCODONE (Oxy IR/ROXICODONE) immediate release tablet 5 mg 10/27 - 10/27 0 of Unknown --    oxyCODONE (ROXICODONE) 5 MG/5ML solution 5 mg 10/27 - 10/27 0 of Unknown --      Group total: 0 of Unknown     HYDROmorphone (DILAUDID) injection 0.25-0.5 mg 10/27 - 10/27 20 of 40-80 --    morphine (MS CONTIN) 12 hr tablet 60 mg 10/27 - No end date 60 of 60 0 of 120    HYDROmorphone (DILAUDID) injection 1 mg 10/27 - No end date 20 of 60 0 of 160    HYDROmorphone (DILAUDID) tablet 2-3 mg 10/27 - No end date 8 of 24-36 16 of 48-72    Daily Totals  108 of Unknown (at least 184-236) 16 of 328-352    Calculation Errors     Order Type Date Details   oxyCODONE (Oxy IR/ROXICODONE) immediate release tablet 5 mg Ordered Dose -- Insufficient frequency information   oxyCODONE (ROXICODONE) 5 MG/5ML solution 5 mg Ordered Dose -- Insufficient frequency information            Patient benefited maximally from hospital stay and there were no complications.    Recent vital signs: Patient Vitals for the past 24 hrs:  BP Temp Temp src Pulse Resp SpO2 Height Weight  05/15/24 0605 131/61 98 F (36.7 C) Oral 78 16 97 % -- --  05/15/24 0131 (!) 128/48 98.6 F (37 C) Oral 82 15 98 % -- --  05/14/24 2054 138/68 98.3 F (36.8 C) Oral 85 15 96 % -- --  05/14/24 1741 (!) 111/54 97.8 F (36.6 C) Axillary 78 18 100 % -- --  05/14/24 1700 (!) 119/90 (!) 97.3 F (36.3 C) -- 65 16 100 % -- --  05/14/24 1600 (!) 112/59 -- -- 60 20 92 % -- --  05/14/24 1515 113/74 (!) 97.2 F (36.2 C) -- 64 16 99 % -- --  05/14/24 1500 (!) 110/47 -- -- 68 16 97 % -- --  05/14/24 1445 (!) 106/59 -- -- 61 17 99 % -- --  05/14/24 1430 92/72 -- -- 66 16 98 % -- --  05/14/24 1415 (!) 105/41 -- -- 62 14 98 % -- --  05/14/24 1405 110/61 -- -- 73 18 95 % -- --  05/14/24 1402 (!) 84/52 97.9 F (36.6 C) -- 67 17 97 % -- --  05/14/24 0907 -- -- -- -- -- -- 5' 3.5 (1.613 m) 114.8 kg  05/14/24 0900 (!)  155/70 98 F (36.7 C) Oral 75 16 97 % 5' 3.5 (1.613 m) 114.8 kg     Recent laboratory studies: No results for input(s): WBC, HGB, HCT, PLT, NA, K, CL, CO2, BUN, CREATININE, GLUCOSE, INR, CALCIUM in the last 72 hours.  Invalid input(s): PT, 2   Discharge Medications:   Allergies as of 05/15/2024       Reactions   Atenolol Nausea And Vomiting   Bad migraines   Cymbalta [duloxetine Hcl] Other (See Comments)   Shakes   Lyrica [pregabalin] Other (See Comments)   Unknown   Methadone Other (See Comments)   Seizures   Paroxetine Nausea And Vomiting   Paxil [paroxetine Hcl] Nausea And Vomiting   Propoxyphene N-acetaminophen Nausea And Vomiting   Sertraline Hcl Nausea And Vomiting   migraine   Zoloft [sertraline Hcl] Other (See Comments)   Migraine        Medication List     STOP taking these medications    celecoxib 200 MG capsule Commonly known as: CELEBREX   HYDROcodone-acetaminophen 10-325 MG tablet Commonly known as: NORCO       TAKE these medications    amLODipine 10 MG tablet Commonly known as: NORVASC Take 10 mg by mouth in the morning.   amphetamine-dextroamphetamine 15 MG tablet Commonly known as: ADDERALL Take 15 mg by mouth 3 (three) times daily.   apixaban 2.5 MG Tabs tablet Commonly known as: Eliquis Take 1 tablet (2.5 mg total) by mouth 2 (two) times daily. To decrease risk of blood clots.   cefadroxil 500 MG capsule Commonly known as: DURICEF Take 1 capsule (500 mg total) by mouth 2 (two) times daily.   dexlansoprazole 60 MG capsule Commonly known as: DEXILANT Take 60 mg by mouth at bedtime.   diazepam 10 MG tablet Commonly known as: VALIUM Take 10 mg by mouth 3 (three) times daily.   docusate sodium 100 MG capsule Commonly known as: Colace Take 1 capsule (100 mg total) by mouth 2 (two) times daily.   esomeprazole 40 MG capsule Commonly known as: NEXIUM Take 40 mg by mouth 2 (two) times daily.    hydrochlorothiazide 50 MG tablet Commonly known as: HYDRODIURIL Take 50 mg by mouth in the morning.   HYDROmorphone 2 MG tablet Commonly known as: Dilaudid Take 1 tablet (2 mg total) by mouth every 4 (four) hours as needed for severe pain (  pain score 7-10).   hydrOXYzine 25 MG tablet Commonly known as: ATARAX Take 25 mg by mouth every 8 (eight) hours as needed for itching.   irbesartan 300 MG tablet Commonly known as: AVAPRO Take 300 mg by mouth daily.   metoprolol succinate 25 MG 24 hr tablet Commonly known as: TOPROL-XL Take 50 mg by mouth in the morning.   morphine 30 MG 12 hr tablet Commonly known as: MS CONTIN Take 60 mg by mouth every 12 (twelve) hours.   multivitamin with minerals tablet Take 1 tablet by mouth daily. Centrum Silver   naloxone 4 MG/0.1ML Liqd nasal spray kit Commonly known as: NARCAN CALL 911. SPR CONTENTS OF ONE SPRAYER (0.1ML) INTO ONE NOSTRIL. REPEAT IN 2-3 MIN IF SYMPTOMS OF OPIOID EMERGENCY PERSIST, ALTERNATE NOSTRILS   promethazine 12.5 MG tablet Commonly known as: PHENERGAN Take 12.5 mg by mouth every 6 (six) hours as needed for nausea or vomiting.   rizatriptan 10 MG tablet Commonly known as: MAXALT Take 10 mg by mouth as needed for migraine. May repeat in 2 hours if needed   rosuvastatin 20 MG tablet Commonly known as: CRESTOR Take 20 mg by mouth at bedtime.   Rybelsus 3 MG Tabs Generic drug: Semaglutide Take 3 mg by mouth daily.   tiZANidine 4 MG tablet Commonly known as: Zanaflex Take 1 tablet (4 mg total) by mouth every 8 (eight) hours as needed for muscle spasms.   vitamin C 1000 MG tablet Take 1,000 mg by mouth daily.   Xopenex HFA 45 MCG/ACT inhaler Generic drug: levalbuterol Inhale 2 puffs into the lungs every 4 (four) hours as needed for shortness of breath.               Discharge Care Instructions  (From admission, onward)           Start     Ordered   05/15/24 0000  Weight bearing as tolerated        Question Answer Comment  Laterality right   Extremity Lower      05/15/24 0813            Diagnostic Studies: DG HIP UNILAT WITH PELVIS 1V RIGHT Result Date: 05/14/2024 CLINICAL DATA:  Elective surgery EXAM: DG HIP (WITH OR WITHOUT PELVIS) 1V RIGHT COMPARISON:  None Available. FINDINGS: Fourteen fluoroscopic spot views of the pelvis and right hip obtained in the operating room. Sequential images during hip arthroplasty. Fluoroscopy time 17 seconds. Dose 5.15 mGy. IMPRESSION: Intraoperative fluoroscopy during right hip arthroplasty. Electronically Signed   By: Andrea Gasman M.D.   On: 05/14/2024 15:01   DG C-Arm 1-60 Min-No Report Result Date: 05/14/2024 Fluoroscopy was utilized by the requesting physician.  No radiographic interpretation.   DG C-Arm 1-60 Min-No Report Result Date: 05/14/2024 Fluoroscopy was utilized by the requesting physician.  No radiographic interpretation.    Disposition: Discharge disposition: 01-Home or Self Care      The patient will need to be set up for home health physical therapy. Discharge Instructions     Call MD / Call 911   Complete by: As directed    If you experience chest pain or shortness of breath, CALL 911 and be transported to the hospital emergency room.  If you develope a fever above 101 F, pus (white drainage) or increased drainage or redness at the wound, or calf pain, call your surgeon's office.   Constipation Prevention   Complete by: As directed    Drink plenty of fluids.  Prune juice  may be helpful.  You may use a stool softener, such as Colace (over the counter) 100 mg twice a day.  Use MiraLax (over the counter) for constipation as needed.   Diet general   Complete by: As directed    Increase activity slowly as tolerated   Complete by: As directed    Post-operative opioid taper instructions:   Complete by: As directed    POST-OPERATIVE OPIOID TAPER INSTRUCTIONS: It is important to wean off of your opioid medication  as soon as possible. If you do not need pain medication after your surgery it is ok to stop day one. Opioids include: Codeine, Hydrocodone(Norco, Vicodin), Oxycodone(Percocet, oxycontin) and hydromorphone amongst others.  Long term and even short term use of opiods can cause: Increased pain response Dependence Constipation Depression Respiratory depression And more.  Withdrawal symptoms can include Flu like symptoms Nausea, vomiting And more Techniques to manage these symptoms Hydrate well Eat regular healthy meals Stay active Use relaxation techniques(deep breathing, meditating, yoga) Do Not substitute Alcohol to help with tapering If you have been on opioids for less than two weeks and do not have pain than it is ok to stop all together.  Plan to wean off of opioids This plan should start within one week post op of your joint replacement. Maintain the same interval or time between taking each dose and first decrease the dose.  Cut the total daily intake of opioids by one tablet each day Next start to increase the time between doses. The last dose that should be eliminated is the evening dose.      Weight bearing as tolerated   Complete by: As directed    Laterality: right   Extremity: Lower        Follow-up Information     Yvone Rush, MD Follow up in 2 week(s).   Specialty: Orthopedic Surgery Contact information: [address] [place] [address] [phone]                  Signed: Lynwood KANDICE Reining 05/15/2024, 8:14 AM

## 2024-05-15 NOTE — Progress Notes (Signed)
 Subjective: 1 Day Post-Op Procedure(s) (LRB): ARTHROPLASTY, HIP, TOTAL, ANTERIOR APPROACH (Right) Patient reports pain as mild.  Complains of some thigh pain.  Patient eating breakfast.  Has not voided yet as Foley was removed this morning.  She stood up yesterday.  Wants to go home today if she does okay with PT.  Objective: Vital signs in last 24 hours: Temp:  [97.2 F (36.2 C)-98.6 F (37 C)] 98 F (36.7 C) (10/28 0605) Pulse Rate:  [60-85] 78 (10/28 0605) Resp:  [14-20] 16 (10/28 0605) BP: (84-155)/(41-90) 131/61 (10/28 0605) SpO2:  [92 %-100 %] 97 % (10/28 0605) Weight:  [114.8 kg] 114.8 kg (10/27 0907)  Intake/Output from previous day: 10/27 0701 - 10/28 0700 In: 3080 [P.O.:480; I.V.:2300; IV Piggyback:300] Out: 2500 [Urine:2100; Blood:400] Intake/Output this shift: No intake/output data recorded.  No results for input(s): HGB in the last 72 hours. No results for input(s): WBC, RBC, HCT, PLT in the last 72 hours. No results for input(s): NA, K, CL, CO2, BUN, CREATININE, GLUCOSE, CALCIUM in the last 72 hours. No results for input(s): LABPT, INR in the last 72 hours. Right hip exam: Neurologically intact Sensation intact distally Intact pulses distally Dorsiflexion/Plantar flexion intact Incision: dressing C/D/I No cellulitis present Compartment soft   Assessment/Plan: 1 Day Post-Op Procedure(s) (LRB): ARTHROPLASTY, HIP, TOTAL, ANTERIOR APPROACH (Right) Plan: Up with therapy weight-bear as tolerated on right. Eliquis 2.5 mg twice daily x 3 weeks postop for DVT prophylaxis. Will need home health physical therapy.  If passes therapy today can be discharged home. Follow-up with Dr. Yvone in 2 weeks.    Lynwood KANDICE Reining 05/15/2024, 8:10 AM
